# Patient Record
Sex: Female | Born: 1975 | Hispanic: No | Smoking: Never smoker
Health system: Southern US, Community
[De-identification: ages and names within clinical notes are randomized; demographics above are authoritative.]

## PROBLEM LIST (undated history)

## (undated) ENCOUNTER — Emergency Department: Admission: EM | Payer: 59

## (undated) DIAGNOSIS — R079 Chest pain, unspecified: Secondary | ICD-10-CM

## (undated) DIAGNOSIS — D649 Anemia, unspecified: Secondary | ICD-10-CM

## (undated) DIAGNOSIS — I10 Essential (primary) hypertension: Secondary | ICD-10-CM

## (undated) DIAGNOSIS — R7303 Prediabetes: Secondary | ICD-10-CM

## (undated) HISTORY — DX: Chest pain, unspecified: R07.9

## (undated) HISTORY — PX: DILATION AND CURETTAGE OF UTERUS: SHX78

---

## 2006-06-17 ENCOUNTER — Observation Stay: Payer: Self-pay | Admitting: Obstetrics and Gynecology

## 2006-06-24 ENCOUNTER — Inpatient Hospital Stay: Payer: Self-pay | Admitting: Obstetrics and Gynecology

## 2006-07-02 ENCOUNTER — Ambulatory Visit: Payer: Self-pay | Admitting: Obstetrics and Gynecology

## 2007-03-06 ENCOUNTER — Other Ambulatory Visit: Payer: Self-pay

## 2007-03-06 ENCOUNTER — Emergency Department: Payer: Self-pay | Admitting: Emergency Medicine

## 2007-03-06 ENCOUNTER — Encounter: Payer: Self-pay | Admitting: Cardiology

## 2007-03-08 ENCOUNTER — Ambulatory Visit: Payer: Self-pay | Admitting: Cardiology

## 2008-08-29 DIAGNOSIS — R079 Chest pain, unspecified: Secondary | ICD-10-CM

## 2009-12-14 ENCOUNTER — Emergency Department: Payer: Self-pay | Admitting: Emergency Medicine

## 2010-03-09 ENCOUNTER — Emergency Department: Payer: Self-pay | Admitting: Emergency Medicine

## 2010-08-06 NOTE — Assessment & Plan Note (Signed)
[facility] OFFICE NOTE   NAME:Gillott, Henli                        MRN:          [id number]  DATE:03/08/2007                            DOB:          14-Aug-1975    Ms. Pickerel is a 35 year old female with no prior cardiac history who I  am asked to evaluate for chest pain.  Note, she has no cardiac risk  factors.  She typically does not have exertional chest pain, nor is  there dyspnea on exertion, orthopnea, PND, pedal edema, palpitations,  presyncope, syncope or chest pain.  On March 05, 2007 while at work  she developed a sharp pain in the left chest area.  The pain radiated  down her left upper extremity.  This lasted for several seconds and then  was replaced with a soreness.  There was no associated nausea,  vomiting, shortness of breath or diaphoresis.  The pain would improve at  times, but it never completely resolved and has not since then.  The  pain could have a pleuritic component and also increased with certain  movements.  It was not exertional, it was not related to food.  She was  seen in the [facility] Room Saturday morning for the chest pain  as it had not resolved.  An electrocardiogram at that time showed a  sinus rhythm at a rate of 59 and there were no ST changes noted.  She  also had lab work that showed a CK-MB of 0.8 which was normal.  Her BUN  and creatinine were normal.  Her liver functions were normal.  Her  hemoglobin was 13.  Her troponin-I was less than 0.1.  She did have a  mildly elevated D-dimer at 0.89.  Her chest x-ray showed no acute  cardiopulmonary abnormality and a followup chest CT secondary to the  increased D-dimer showed no pulmonary embolus.  There was also mentioned  that there was no thoracic aortic aneurysm or dissection.  Note, she has  not traveled recently, nor has she injured her legs.  She is on no  medications at the present other than Motrin.  SHE  HAS NO KNOWN DRUG  ALLERGIES.   SOCIAL HISTORY:  She denies any drug use, alcohol use or tobacco use.   FAMILY HISTORY:  Negative for coronary artery disease.   PAST MEDICAL HISTORY:  No diabetes mellitus, hypertension or  hyperlipidemia.  She has had a previous C-section but no other surgeries  are noted.   REVIEW OF SYSTEMS:  She denies any headaches or fevers or chills.  There  is no productive cough or hemoptysis.  There is no dysphagia,  odynophagia, melena or hematochezia.  There is no dysuria or hematuria.  There is no rash or seizure activity.  There is no orthopnea, PND or  pedal edema.  The remaining systems are negative.   PHYSICAL EXAMINATION:  Shows a blood pressure of 120/88 initially.  On  followup in the right arm she was 148/90 and in left arm it was 138/88.  Her pulse is 71.  She weighs  237 pounds.  She is well-developed and well-  nourished, in no acute distress.  SKIN:  Warm and dry.  She does not appear to be depressed and there is no peripheral clubbing.  BACK:  Normal.  HEENT:  Normal with normal eyelids.  NECK:  Supple with normal upstroke bilaterally, no bruits noted.  There  is no jugular venous distention and no thyromegaly is noted.  CHEST:  Clear to auscultation with normal expansion.  CARDIOVASCULAR:  Reveals a regular rate and rhythm with normal S1 and  S2.  There are no murmurs, rubs or gallops noted.  She is minimally  tender to palpation in the left chest area.  ABDOMINAL:  Not tender or distended, positive bowel sounds, no  hepatosplenomegaly and no mass appreciated.  There is no abdominal  bruit.  She has 2+ femoral pulses bilaterally, no bruits.  EXTREMITIES:  Show no edema and I could palpate no cords.  She has 2+  dorsalis pedis pulses bilaterally.  NEUROLOGICAL:  Grossly intact.   Her electrocardiogram shows a sinus rhythm at a rate of 71.  The axis is  normal.  There are no ST changes noted.   DIAGNOSES:  Atypical chest pain -- Ms.  Berman symptoms are extremely  atypical for cardiac etiology.  Note, there are no rubs on examination.  Her electrocardiograms are normal.  Also note that she had blood work  drawn in [facility] Saturday morning which was 12 hours  after her symptoms started.  At that time her troponin was normal and  she has therefore ruled out.  Also note that her symptoms have been  persistent since then.  Her chest CT showed no pulmonary embolus or  dissection and her liver functions were normal.  I think this is most  likely musculoskeletal pain.  She will complete her course of Motrin.  If it does not improve after this then I have asked her to follow up  with Dr. __________  concerning that issue.  She will see [place] back on an  as-needed basis.     Madolyn Frieze Jens Som, MD, [facility]  Electronically Signed    BSC/MedQ  DD: 03/08/2007  DT: 03/08/2007  Job #: [id number]   cc:   Vonita Moss, MD

## 2010-09-12 ENCOUNTER — Encounter: Payer: Self-pay | Admitting: Cardiovascular Disease

## 2016-04-26 ENCOUNTER — Emergency Department
Admission: EM | Admit: 2016-04-26 | Discharge: 2016-04-26 | Disposition: A | Discharge status: Home / Self Care | Payer: BLUE CROSS/BLUE SHIELD | Attending: Emergency Medicine | Admitting: Emergency Medicine

## 2016-04-26 ENCOUNTER — Emergency Department: Payer: BLUE CROSS/BLUE SHIELD

## 2016-04-26 DIAGNOSIS — N938 Other specified abnormal uterine and vaginal bleeding: Secondary | ICD-10-CM

## 2016-04-26 DIAGNOSIS — N939 Abnormal uterine and vaginal bleeding, unspecified: Secondary | ICD-10-CM | POA: Diagnosis present

## 2016-04-26 HISTORY — DX: Anemia, unspecified: D64.9

## 2016-04-26 LAB — CBC
HCT: 34.4 % — ABNORMAL LOW (ref 35.0–47.0)
Hemoglobin: 10.5 g/dL — ABNORMAL LOW (ref 12.0–16.0)
MCH: 22.9 pg — ABNORMAL LOW (ref 26.0–34.0)
MCHC: 30.6 g/dL — ABNORMAL LOW (ref 32.0–36.0)
MCV: 74.7 fL — ABNORMAL LOW (ref 80.0–100.0)
Platelets: 343 10*3/uL (ref 150–440)
RBC: 4.6 MIL/uL (ref 3.80–5.20)
RDW: 17.6 % — ABNORMAL HIGH (ref 11.5–14.5)
WBC: 6.3 10*3/uL (ref 3.6–11.0)

## 2016-04-26 LAB — BASIC METABOLIC PANEL
Anion gap: 5 (ref 5–15)
BUN: 9 mg/dL (ref 6–20)
CO2: 26 mmol/L (ref 22–32)
Calcium: 8.9 mg/dL (ref 8.9–10.3)
Chloride: 104 mmol/L (ref 101–111)
Creatinine, Ser: 0.79 mg/dL (ref 0.44–1.00)
GFR calc Af Amer: >60 mL/min (ref >60)
GFR calc non Af Amer: >60 mL/min (ref >60)
Glucose, Bld: 147 mg/dL — ABNORMAL HIGH (ref 65–99)
Potassium: 3.6 mmol/L (ref 3.5–5.1)
Sodium: 135 mmol/L (ref 135–145)

## 2016-04-26 LAB — POCT PREGNANCY, URINE: Preg Test, Ur: NEGATIVE

## 2016-04-26 MED ORDER — NORGESTIMATE-ETH ESTRADIOL 0.25-35 MG-MCG PO TABS: 1.0000 | ORAL_TABLET | Freq: Every day | ORAL | 1 refills | Status: DC

## 2016-04-26 NOTE — ED Provider Notes (Signed)
[facility] Emergency Department Provider Note  Time seen: 2:43 PM  I have reviewed the triage vital signs and the nursing notes.   HISTORY  Chief Complaint Vaginal Bleeding    HPI Krista Garza is a 41 y.o. female with a past medical history of anemia, presents to the emergency department with vaginal bleeding. According to the patient she had sustained vaginal bleeding with large clots for 1 month ending approximately 2 weeks ago. Patient states she has never had this before. She saw a GYN on Tuesday who said that everything looked normal and was going to schedule her for an ultrasound. She states 2 days ago she once again started with heavy bleeding with clots show she came to the emergency department today for evaluation. Patient states she is anemic at baseline with a hemoglobin usually around 9 or 10 per patient. Patient was concerned given the recent heavy bleeding and now return of bleeding that she could have a low blood level.States lower abdominal cramping consistent with a menstrual cycle but denies any "pain."  Past Medical History:  Diagnosis Date  . Anemia   . Chest pain, unspecified     Patient Active Problem List   Diagnosis Date Noted  . CHEST PAIN-UNSPECIFIED 08/29/2008    Past Surgical History:  Procedure Laterality Date  . CESAREAN SECTION      Prior to Admission medications   Not on File    No Known Allergies  No family history on file.  Social History Social History  Substance Use Topics  . Smoking status: Unknown If Ever Smoked  . Smokeless tobacco: Never Used  . Alcohol use No    Review of Systems Constitutional: Negative for fever Cardiovascular: Negative for chest pain. Respiratory: Negative for shortness of breath. Gastrointestinal:Mild lower abdominal cramping. Negative for nausea and vomiting or diarrhea. Genitourinary: Negative for dysuria. Positive vaginal bleeding with clot. Neurological: Negative for  headache 10-point ROS otherwise negative.  ____________________________________________   PHYSICAL EXAM:  VITAL SIGNS: ED Triage Vitals  Enc Vitals Group     BP 04/26/16 1234 (!) 158/83     Pulse Rate 04/26/16 1234 62     Resp 04/26/16 1234 16     Temp 04/26/16 1234 98.1 F (36.7 C)     Temp Source 04/26/16 1234 Oral     SpO2 04/26/16 1234 98 %     Weight 04/26/16 1235 236 lb (107 kg)     Height 04/26/16 1235 6\' 2"  (1.88 m)     Head Circumference --      Peak Flow --      Pain Score --      Pain Loc --      Pain Edu? --      Excl. in [place]? --     Constitutional: Alert and oriented. Well appearing and in no distress. Eyes: Normal exam ENT   Head: Normocephalic and atraumatic.   Mouth/Throat: Mucous membranes are moist. Cardiovascular: Normal rate, regular rhythm. No murmur Respiratory: Normal respiratory effort without tachypnea nor retractions. Breath sounds are clear  Gastrointestinal: Soft and nontender. No distention.  Musculoskeletal: Nontender with normal range of motion in all extremities Neurologic:  Normal speech and language. No gross focal neurologic deficits Skin:  Skin is warm, dry and intact.  Psychiatric: Mood and affect are normal.   ____________________________________________     RADIOLOGY  IMPRESSION: 1. Thickened endometrial stripe measuring 23.4 mm. If bleeding remains unresponsive to hormonal or medical therapy, focal lesion work-up with  sonohysterogram should be considered. Endometrial biopsy should also be considered in pre-menopausal patients at high risk for endometrial carcinoma. (Ref: Radiological Reasoning: Algorithmic Workup of Abnormal Vaginal Bleeding with Endovaginal Sonography and Sonohysterography. AJR 2008; [id number]) 2. Normal appearance of the right ovary; nonvisualized left ovary.  ____________________________________________   INITIAL IMPRESSION / ASSESSMENT AND PLAN / ED COURSE  Pertinent labs & imaging results  that were available during my care of the patient were reviewed by me and considered in my medical decision making (see chart for details).  Patient presents emergency Department with dysfunctional uterine bleeding. Continues to state heavy bleeding. Into her OB/GYN several days ago and had a pelvic exam performed and stated everything looked normal. Today We will obtain an ultrasound to evaluate. Patient's labs are largely within normal limits, hemoglobin is largely unchanged from her last hemoglobin recorded in October.  Ultrasound shows a thickened endometrial stripe of 23 mm. We will place the patient on a taper of Sprintec. Patient is follow up with her OB on Monday. I discussed with the patient returning to the emergency department for any increase in bleeding, lightheadedness, dizziness, or the bleeding fails to respond to the hormonal treatment in the next 24-48 hours.  ____________________________________________   FINAL CLINICAL IMPRESSION(S) / ED DIAGNOSES  Dysfunctional uterine bleeding    Harvest Dark, MD 04/26/16 [id number]

## 2016-04-26 NOTE — Discharge Instructions (Signed)
Please take your medication as prescribed. As we discussed return to the emergency department for any dizziness, lightheadedness, increased bleeding or failure of the bleeding to decrease after 24-48 hours. Otherwise please follow-up with your OB/GYN on Monday for recheck/reevaluation.

## 2016-04-26 NOTE — ED Triage Notes (Signed)
Pt came to ED c/o adnormal vaginal bleeding. Has been diagnosed with being anemic. Has had abnormal bleeding in the past. Saw specialist but has not been diagnosed with anything. VS stable.

## 2018-03-29 IMAGING — US US TRANSVAGINAL NON-OB
1 series · 13 of 25 positions shown · non-contrast
Comparison: None

CLINICAL DATA: Heavy vaginal bleeding since 04/24/2016. Gravida 4
para 4.



[Series 1: us transvaginal non-ob · 0.26mm/px · 113 acquisitions, 13 frames shown]
[im 1/113]
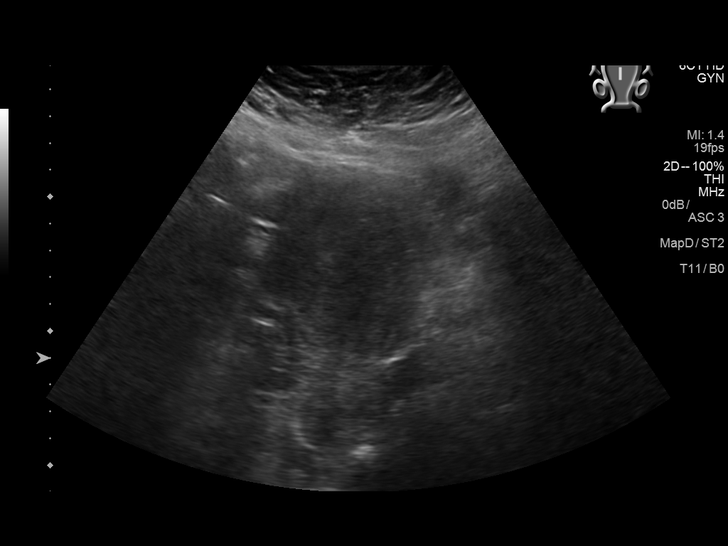
[im 10/113]
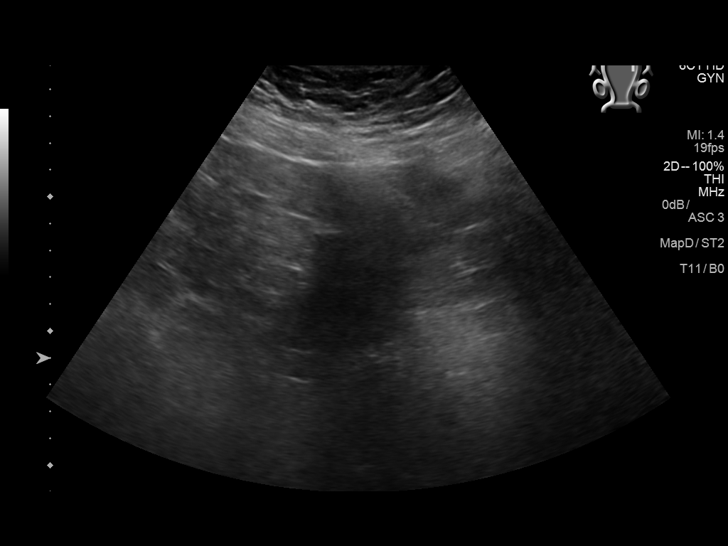
[im 19/113]
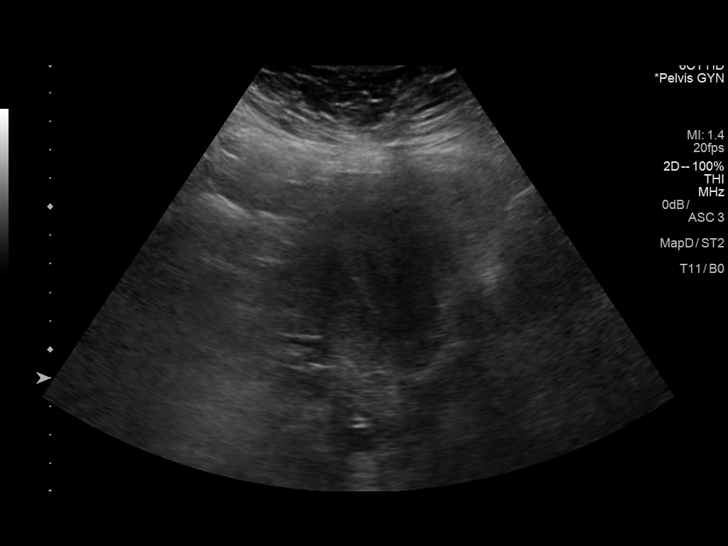
[im 29/113]
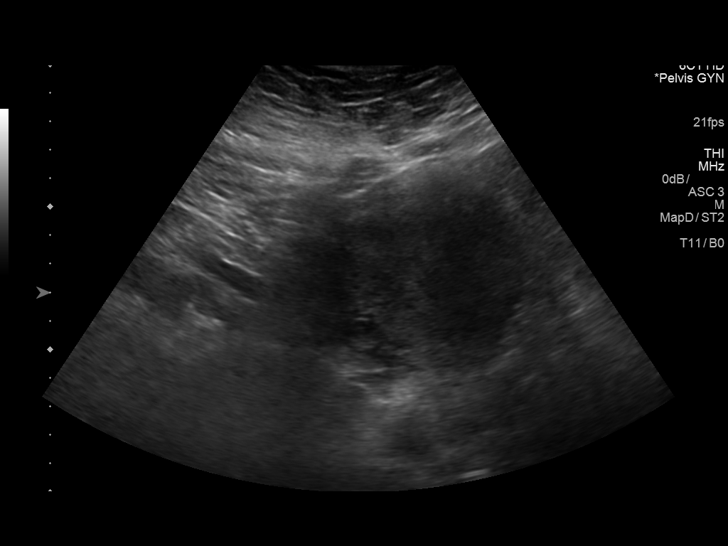
[im 38/113]
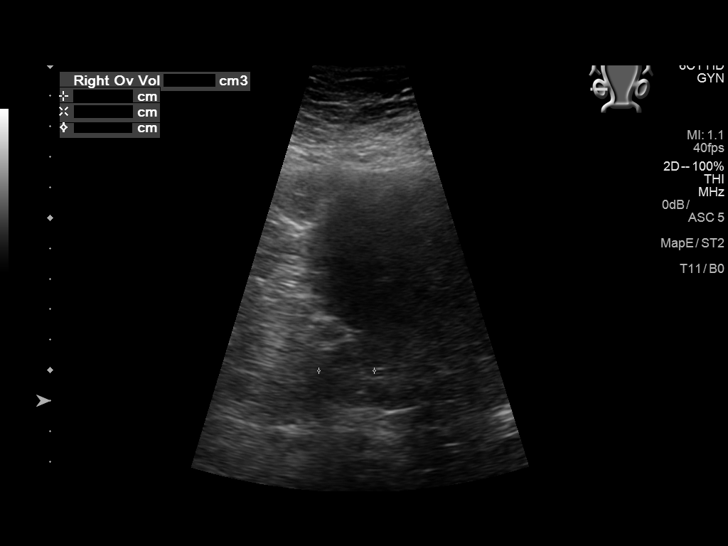
[im 47/113]
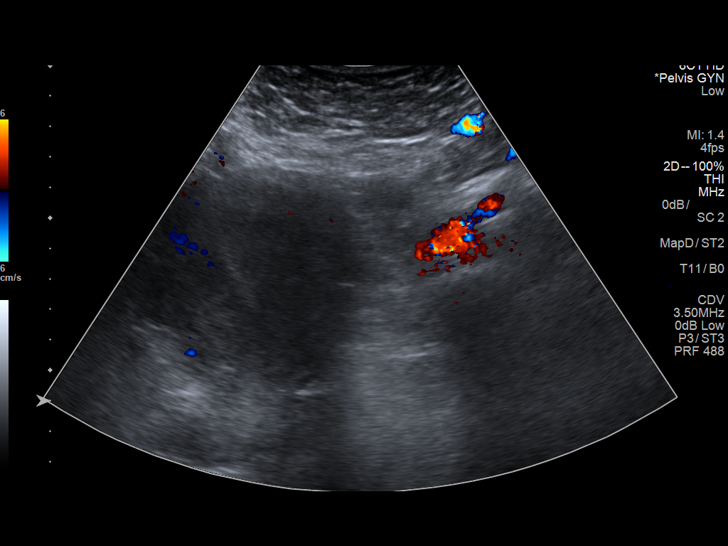
[im 57/113]
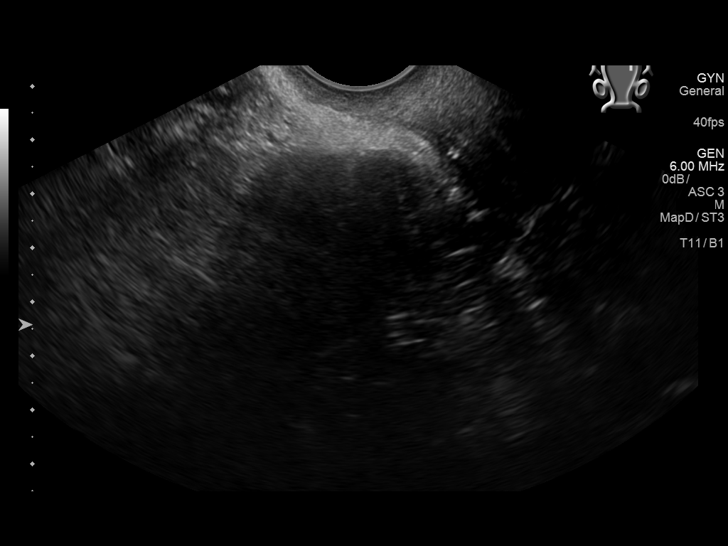
[im 66/113]
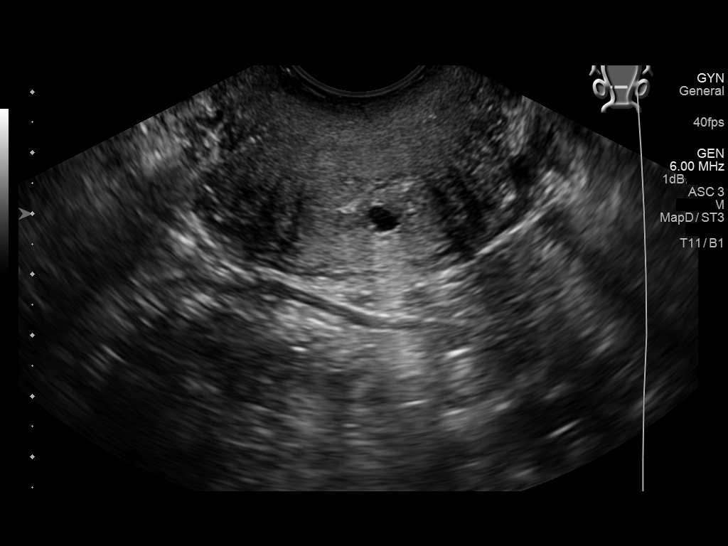
[im 75/113]
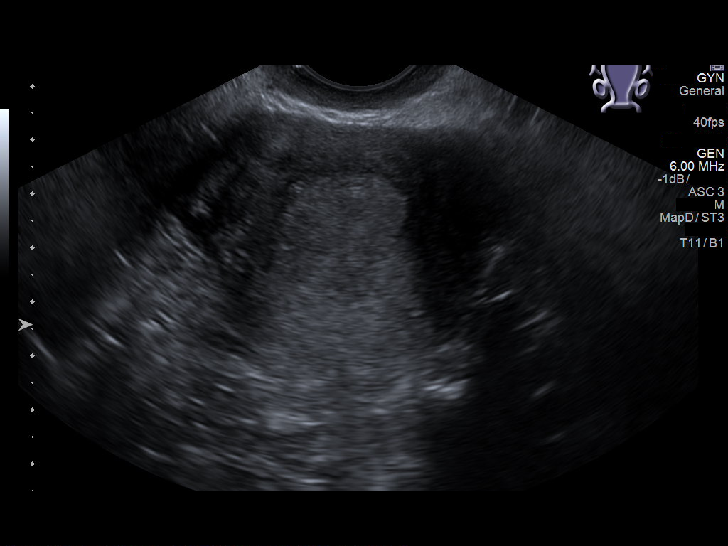
[im 85/113]
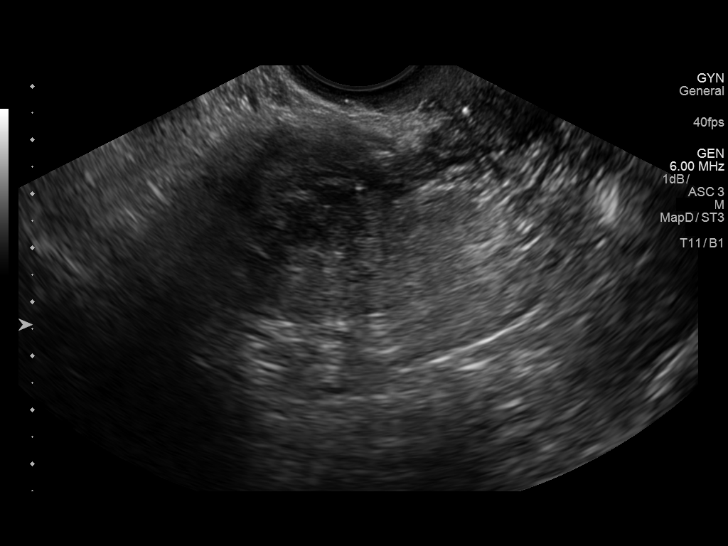
[im 94/113]
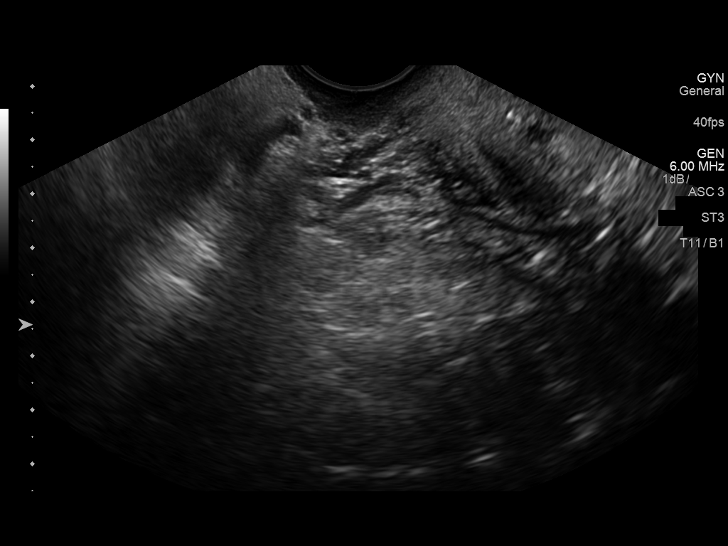
[im 103/113]
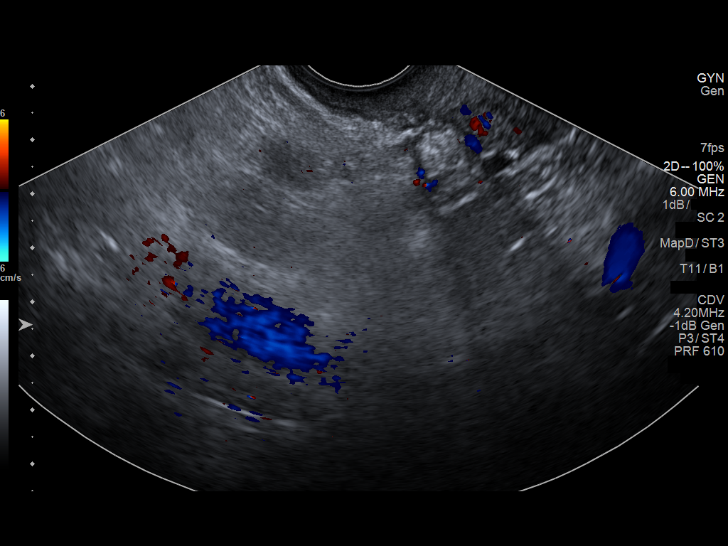
[im 113/113]
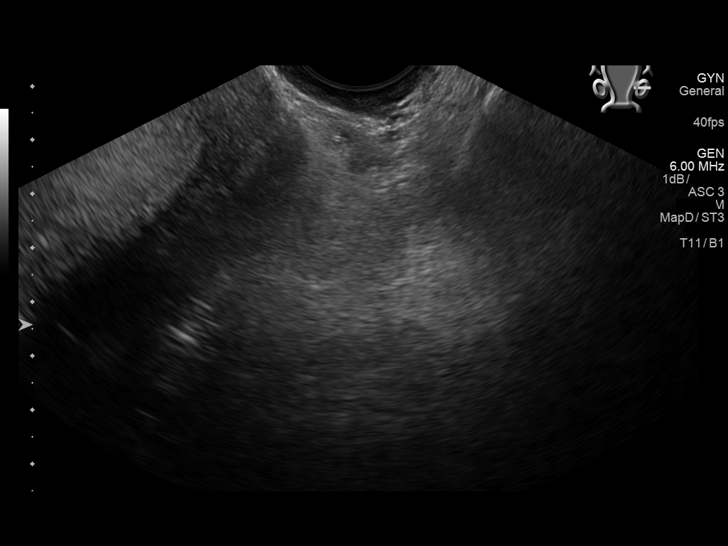

[13 of 25 positions shown; findings below may reference images not displayed]

FINDINGS: Uterus

Measurements: 11.0 x 6.1 x 6.2 cm. No fibroids or other mass
visualized.

Endometrium

Thickness: 23.4 mm. No discrete lesions are identified. Endometrium
appears homogeneous and vascular on Doppler evaluation.

Right ovary

Measurements: 3.1 x 1.7 x 1.8 cm. Normal appearance/no adnexal mass.

Left ovary

Measurements: The ovary is not visualized, either absent or obscured
. No adnexal mass identified.

Other findings

Trace free pelvic fluid.
IMPRESSION: 1. Thickened endometrial stripe measuring 23.4 mm. If bleeding
remains unresponsive to hormonal or medical therapy, focal lesion
work-up with sonohysterogram should be considered. Endometrial
biopsy should also be considered in pre-menopausal patients at high
risk for endometrial carcinoma. (Ref: Radiological Reasoning:
Algorithmic Workup of Abnormal Vaginal Bleeding with Endovaginal
Sonography and Sonohysterography. AJR 6999; [id number])
2. Normal appearance of the right ovary; nonvisualized left ovary.

## 2019-03-02 ENCOUNTER — Ambulatory Visit (INDEPENDENT_AMBULATORY_CARE_PROVIDER_SITE_OTHER): Payer: No Typology Code available for payment source | Admitting: Family Medicine

## 2019-03-02 ENCOUNTER — Encounter: Payer: Self-pay | Admitting: Family Medicine

## 2019-03-02 ENCOUNTER — Other Ambulatory Visit: Payer: Self-pay

## 2019-03-02 VITALS — BP 122/86 | HR 94 | Temp 98.3°F | Resp 14 | Ht 74.0 in | Wt 251.8 lb

## 2019-03-02 DIAGNOSIS — H539 Unspecified visual disturbance: Secondary | ICD-10-CM | POA: Diagnosis not present

## 2019-03-02 DIAGNOSIS — Z862 Personal history of diseases of the blood and blood-forming organs and certain disorders involving the immune mechanism: Secondary | ICD-10-CM | POA: Diagnosis not present

## 2019-03-02 DIAGNOSIS — Z7689 Persons encountering health services in other specified circumstances: Secondary | ICD-10-CM

## 2019-03-02 DIAGNOSIS — R002 Palpitations: Secondary | ICD-10-CM

## 2019-03-02 DIAGNOSIS — N924 Excessive bleeding in the premenopausal period: Secondary | ICD-10-CM | POA: Diagnosis not present

## 2019-03-02 DIAGNOSIS — Z800 Family history of malignant neoplasm of digestive organs: Secondary | ICD-10-CM

## 2019-03-02 DIAGNOSIS — R55 Syncope and collapse: Secondary | ICD-10-CM

## 2019-03-02 DIAGNOSIS — Z1211 Encounter for screening for malignant neoplasm of colon: Secondary | ICD-10-CM

## 2019-03-02 DIAGNOSIS — D509 Iron deficiency anemia, unspecified: Secondary | ICD-10-CM

## 2019-03-02 NOTE — Progress Notes (Signed)
Name: Krista Garza   MRN: [id number]    DOB: 1976-02-13   Date:03/07/2019       Progress Note  Chief Complaint  Patient presents with  . Establish Care  . Contraception    wants referral for gyn for birth control implant in arm  . Near Syncope    several episodes in last year states her vision get blurry in left eye and feels dizzy     Subjective:   Krista Garza is a 43 y.o. female, presents to clinic to establish care. She was previously seen at [facility] primary care  She would like OBGYN referral to get nexplanon for worsening periods Menses right now once a month, last 5-7 days, varies how heavy periods are - "gushing" has to change tampons and thick pads every 1.5 to 2 hours and heaviness lasts 2-3 days and it lightens up.   Her mom went through menopause before age 52   She has 4 children:  G4P4 2  vag deliveries, 2 c-sections   Two boys - 02/04/93 and 03/25/96 - NVD C-section 06/2006 01/31/00 D&C in 2001 or 2002 after birth of third child 01/31/2000 - C-section  She states she is going to get an optometry appt to check eyes  She is also concerned with intermittent dizziness and near passing out episodes happening over the past year.    She suspects perimenopausal sx, hot and cold flashes, worsening periods    She says she is having random episodes where she will have some blurry vision in her left eye she feels like she might be little bit dizzy or pass out it lasts only a few minutes and then it passes.  A few times this is made her very anxious and she has had some secondary rapid heart rate feeling like she hyperventilates.  She has discussed this with family members and they have reassured her that she is probably just anxious or having menopausal symptoms.  She has never had associated chest pain, syncopal episode, chest tightness, shortness of breath, orthopnea, PND, lower extremity edema.  Patient Active Problem List   Diagnosis Date Noted  . CHEST  PAIN-UNSPECIFIED 08/29/2008    Past Surgical History:  Procedure Laterality Date  . CESAREAN SECTION     x2    Family History  Problem Relation Age of Onset  . Diabetes Father   . Hyperlipidemia Father   . Hypertension Father   . Cancer Father 22       colon  . Colon cancer Father     Social History   Socioeconomic History  . Marital status: Divorced    Spouse name: Not on file  . Number of children: 4  . Years of education: 67  . Highest education level: Associate degree: occupational, Hotel manager, or vocational program  Occupational History  . Occupation: Full time  Tobacco Use  . Smoking status: Never Smoker  . Smokeless tobacco: Never Used  Substance and Sexual Activity  . Alcohol use: Yes    Comment: margaritas occasionally  . Drug use: No  . Sexual activity: Not Currently  Other Topics Concern  . Not on file  Social History Narrative   Divorced   Does not get regular exercise   Social Determinants of Health   Financial Resource Strain: Low Risk   . Difficulty of Paying Living Expenses: Not hard at all  Food Insecurity: No Food Insecurity  . Worried About Charity fundraiser in the Last Year: Never  true  . Ran Out of Food in the Last Year: Never true  Transportation Needs: No Transportation Needs  . Lack of Transportation (Medical): No  . Lack of Transportation (Non-Medical): No  Physical Activity: Sufficiently Active  . Days of Exercise per Week: 2 days  . Minutes of Exercise per Session: 80 min  Stress: No Stress Concern Present  . Feeling of Stress : Not at all  Social Connections: Somewhat Isolated  . Frequency of Communication with Friends and Family: More than three times a week  . Frequency of Social Gatherings with Friends and Family: Never  . Attends Religious Services: More than 4 times per year  . Active Member of Clubs or Organizations: No  . Attends Archivist Meetings: Never  . Marital Status: Divorced  Human resources officer  Violence: Not At Risk  . Fear of Current or Ex-Partner: No  . Emotionally Abused: No  . Physically Abused: No  . Sexually Abused: No    No current outpatient medications on file.  No Known Allergies  I personally reviewed active problem list, medication list, allergies, family history, social history, health maintenance, notes from last encounter, lab results, imaging with the patient/caregiver today.  Review of Systems  Constitutional: Negative.  Negative for activity change, appetite change, fatigue and unexpected weight change.  HENT: Negative.   Eyes: Positive for visual disturbance. Negative for photophobia, pain, discharge, redness and itching.  Respiratory: Negative.  Negative for shortness of breath.   Cardiovascular: Negative.  Negative for chest pain, palpitations and leg swelling.  Gastrointestinal: Negative.  Negative for abdominal pain and blood in stool.  Endocrine: Negative.   Genitourinary: Negative.   Musculoskeletal: Negative.  Negative for arthralgias, gait problem, joint swelling and myalgias.  Skin: Negative.  Negative for color change, pallor and rash.  Allergic/Immunologic: Negative.   Neurological: Negative.  Negative for syncope and weakness.  Hematological: Negative.   Psychiatric/Behavioral: Negative.  Negative for confusion, dysphoric mood, self-injury and suicidal ideas. The patient is not nervous/anxious.   All other systems reviewed and are negative.    Objective:    Vitals:   03/02/19 1025  BP: 122/86  Pulse: 94  Resp: 14  Temp: 98.3 F (36.8 C)  SpO2: 96%  Weight: 251 lb 12.8 oz (114.2 kg)  Height: 6\' 2"  (1.88 m)    Body mass index is 32.33 kg/m.  Physical Exam Vitals and nursing note reviewed.  Constitutional:      General: She is not in acute distress.    Appearance: Normal appearance. She is well-developed. She is obese. She is not ill-appearing, toxic-appearing or diaphoretic.  HENT:     Head: Normocephalic and atraumatic.      Right Ear: External ear normal.     Left Ear: External ear normal.     Nose: Nose normal.     Mouth/Throat:     Mouth: Mucous membranes are moist.     Pharynx: Oropharynx is clear. Uvula midline.  Eyes:     General: Lids are normal. No scleral icterus.       Right eye: No discharge.        Left eye: No discharge.     Extraocular Movements: Extraocular movements intact.     Conjunctiva/sclera: Conjunctivae normal.     Pupils: Pupils are equal, round, and reactive to light.  Neck:     Thyroid: No thyromegaly.     Trachea: Phonation normal. No tracheal deviation.  Cardiovascular:     Rate and Rhythm: Normal rate  and regular rhythm.     Pulses: Normal pulses.          Radial pulses are 2+ on the right side and 2+ on the left side.       Posterior tibial pulses are 2+ on the right side and 2+ on the left side.     Heart sounds: Normal heart sounds. No murmur. No friction rub. No gallop.   Pulmonary:     Effort: Pulmonary effort is normal. No respiratory distress.     Breath sounds: Normal breath sounds. No stridor. No wheezing, rhonchi or rales.  Chest:     Chest wall: No tenderness.  Abdominal:     General: Bowel sounds are normal. There is no distension.     Palpations: Abdomen is soft.     Tenderness: There is no abdominal tenderness. There is no guarding or rebound.  Musculoskeletal:        General: No deformity. Normal range of motion.     Cervical back: Normal range of motion and neck supple.  Lymphadenopathy:     Cervical: No cervical adenopathy.  Skin:    General: Skin is warm and dry.     Capillary Refill: Capillary refill takes less than 2 seconds.     Coloration: Skin is not pale.     Findings: No rash.  Neurological:     General: No focal deficit present.     Mental Status: She is alert and oriented to person, place, and time.     Cranial Nerves: No cranial nerve deficit.     Sensory: No sensory deficit.     Motor: No weakness or abnormal muscle tone.      Coordination: Coordination normal.     Gait: Gait normal.  Psychiatric:        Mood and Affect: Mood normal.        Speech: Speech normal.        Behavior: Behavior normal.        Recent Results (from the past 2160 hour(s))  CBC w/ Diff     Status: Abnormal   Collection Time: 03/02/19 12:00 AM  Result Value Ref Range   WBC 5.7 3.8 - 10.8 Thousand/uL   RBC 5.04 3.80 - 5.10 Million/uL   Hemoglobin 9.4 (L) 11.7 - 15.5 g/dL   HCT 34.4 (L) 35.0 - 45.0 %   MCV 68.3 (L) 80.0 - 100.0 fL   MCH 18.7 (L) 27.0 - 33.0 pg   MCHC 27.3 (L) 32.0 - 36.0 g/dL   RDW 17.1 (H) 11.0 - 15.0 %   Platelets 429 (H) 140 - 400 Thousand/uL   MPV 10.6 7.5 - 12.5 fL   Neutro Abs 3,141 1,500 - 7,800 cells/uL   Lymphs Abs 1,653 850 - 3,900 cells/uL   Absolute Monocytes 638 200 - 950 cells/uL   Eosinophils Absolute 211 15 - 500 cells/uL   Basophils Absolute 57 0 - 200 cells/uL   Neutrophils Relative % 55.1 %   Total Lymphocyte 29.0 %   Monocytes Relative 11.2 %   Eosinophils Relative 3.7 %   Basophils Relative 1.0 %  CMP w GFR     Status: None   Collection Time: 03/02/19 12:00 AM  Result Value Ref Range   Glucose, Bld 83 65 - 99 mg/dL    Comment: .            Fasting reference interval .    BUN 11 7 - 25 mg/dL   Creat 0.70 0.50 - 1.10  mg/dL   GFR, Est Non African American 105 > OR = 60 mL/min/1.18m2   GFR, Est African American 121 > OR = 60 mL/min/1.10m2   BUN/Creatinine Ratio NOT APPLICABLE 6 - 22 (calc)   Sodium 137 135 - 146 mmol/L   Potassium 4.2 3.5 - 5.3 mmol/L   Chloride 104 98 - 110 mmol/L   CO2 26 20 - 32 mmol/L   Calcium 9.4 8.6 - 10.2 mg/dL   Total Protein 7.3 6.1 - 8.1 g/dL   Albumin 4.0 3.6 - 5.1 g/dL   Globulin 3.3 1.9 - 3.7 g/dL (calc)   AG Ratio 1.2 1.0 - 2.5 (calc)   Total Bilirubin 0.3 0.2 - 1.2 mg/dL   Alkaline phosphatase (APISO) 47 31 - 125 U/L   AST 15 10 - 35 U/L   ALT 8 6 - 29 U/L  TSH     Status: None   Collection Time: 03/02/19 12:00 AM  Result Value Ref Range    TSH 1.50 mIU/L    Comment:           Reference Range .           > or = 20 Years  0.40-4.50 .                Pregnancy Ranges           First trimester    0.26-2.66           Second trimester   0.55-2.73           Third trimester    0.43-2.91   CBC MORPHOLOGY     Status: None   Collection Time: 03/02/19 12:00 AM  Result Value Ref Range   CBC MORPHOLOGY  NORMAL    Comment: Elliptocytes 1 + Poikilocytosis 1 + Hypochromasia 2 +      PHQ2/9: Depression screen [facility] 2/9 03/02/2019  Decreased Interest 0  Down, Depressed, Hopeless 0  PHQ - 2 Score 0  Altered sleeping 0  Tired, decreased energy 0  Change in appetite 0  Feeling bad or failure about yourself  0  Trouble concentrating 0  Moving slowly or fidgety/restless 0  Suicidal thoughts 0  PHQ-9 Score 0  Difficult doing work/chores Not difficult at all    phq 9 is negative reviewed  Fall Risk: Fall Risk  03/02/2019  Falls in the past year? 0  Number falls in past yr: 0  Injury with Fall? 0      Functional Status Survey: Is the patient deaf or have difficulty hearing?: No Does the patient have difficulty seeing, even when wearing glasses/contacts?: No Does the patient have difficulty concentrating, remembering, or making decisions?: No Does the patient have difficulty walking or climbing stairs?: No Does the patient have difficulty dressing or bathing?: No Does the patient have difficulty doing errands alone such as visiting a doctor's office or shopping?: No    Assessment & Plan:     ICD-10-CM   1. Visual disturbances  H53.9    brief vision disturbances with episodes that are brief, occurring few times for few minutes over the past year -grossly normal neuro and [facility] here today - f/up  2. History of anemia  Z86.2 CBC w/ Diff   with her sx, r/o anemia, check CBCf  3. Near syncope  R55 CBC w/ Diff    CMP w GFR    TSH   BP and HR WNL today, pt encouraged to push hydration, check labs to r/o thyroid dysfunction,  anemia, electrolyte derangement  4. Excessive bleeding in premenopausal period  N92.4 Ambulatory referral to Obstetrics / Gynecology   heavier periods - OBGYN f/up - may be normal perimenopause sx, could be evaluated for polyps, tx with OCPs/progesterone etc  5. Palpitations  R00.2 CBC w/ Diff    TSH   r/o thyroid dysfunction, anemia, electrolyte abnormality, cardiac exam here RRR, no current sx, past ER visits, possibly some anxiety sx, but r/o other causes  6. Family history of colon cancer in father  Z62.0 Ambulatory referral to Gastroenterology   young [place] in father, refer to GI for colonoscopy  7. Colon cancer screening  Z12.11 Ambulatory referral to Gastroenterology  8. Microcytic anemia  D50.9 Iron, TIBC and Ferritin Panel  9. Encounter to establish care with new doctor  Z76.89    reviewed available records    Plan for visual discurbances is to have pt go to optometry or ophtho - we discussed differences and she want to go get just the basic eye exam done with optometry - her insurance requires her to arrange through [facility] - talked to her about that process.  F/up in office ASAP if any worsening in frequency or severity  Some worsening periods, some other sx may be perimenopausal sx, she is going to GYN as well to discuss tx options, she wants hysterectomy  New here - will obtain and review records  Return in about 3 months (around 05/31/2019) for Routine follow-up/physical when due.   Delsa Grana, PA-C 03/02/19 4:43 PM

## 2019-03-04 ENCOUNTER — Telehealth: Payer: Self-pay | Admitting: Family Medicine

## 2019-03-08 ENCOUNTER — Encounter: Payer: Self-pay | Admitting: *Deleted

## 2019-03-08 LAB — COMPLETE METABOLIC PANEL WITH GFR
AG Ratio: 1.2 (calc) (ref 1.0–2.5)
ALT: 8 U/L (ref 6–29)
AST: 15 U/L (ref 10–35)
Albumin: 4 g/dL (ref 3.6–5.1)
Alkaline phosphatase (APISO): 47 U/L (ref 31–125)
BUN: 11 mg/dL (ref 7–25)
CO2: 26 mmol/L (ref 20–32)
Calcium: 9.4 mg/dL (ref 8.6–10.2)
Chloride: 104 mmol/L (ref 98–110)
Creat: 0.7 mg/dL (ref 0.50–1.10)
GFR, Est African American: 121 mL/min/{1.73_m2} (ref >=60)
GFR, Est Non African American: 105 mL/min/{1.73_m2} (ref >=60)
Globulin: 3.3 g/dL (calc) (ref 1.9–3.7)
Glucose, Bld: 83 mg/dL (ref 65–99)
Potassium: 4.2 mmol/L (ref 3.5–5.3)
Sodium: 137 mmol/L (ref 135–146)
Total Bilirubin: 0.3 mg/dL (ref 0.2–1.2)
Total Protein: 7.3 g/dL (ref 6.1–8.1)

## 2019-03-08 LAB — TEST AUTHORIZATION

## 2019-03-08 LAB — CBC WITH DIFFERENTIAL/PLATELET
Absolute Monocytes: 638 cells/uL (ref 200–950)
Basophils Absolute: 57 cells/uL (ref 0–200)
Basophils Relative: 1 %
Eosinophils Absolute: 211 cells/uL (ref 15–500)
Eosinophils Relative: 3.7 %
HCT: 34.4 % — ABNORMAL LOW (ref 35.0–45.0)
Hemoglobin: 9.4 g/dL — ABNORMAL LOW (ref 11.7–15.5)
Lymphs Abs: 1653 cells/uL (ref 850–3900)
MCH: 18.7 pg — ABNORMAL LOW (ref 27.0–33.0)
MCHC: 27.3 g/dL — ABNORMAL LOW (ref 32.0–36.0)
MCV: 68.3 fL — ABNORMAL LOW (ref 80.0–100.0)
MPV: 10.6 fL (ref 7.5–12.5)
Monocytes Relative: 11.2 %
Neutro Abs: 3141 cells/uL (ref 1500–7800)
Neutrophils Relative %: 55.1 %
Platelets: 429 10*3/uL — ABNORMAL HIGH (ref 140–400)
RBC: 5.04 10*6/uL (ref 3.80–5.10)
RDW: 17.1 % — ABNORMAL HIGH (ref 11.0–15.0)
Total Lymphocyte: 29 %
WBC: 5.7 10*3/uL (ref 3.8–10.8)

## 2019-03-08 LAB — IRON,TIBC AND FERRITIN PANEL
%SAT: 4 % (calc) — ABNORMAL LOW (ref 16–45)
Ferritin: <1 ng/mL — ABNORMAL LOW (ref 16–232)
Iron: 17 ug/dL — ABNORMAL LOW (ref 40–190)
TIBC: 443 mcg/dL (calc) (ref 250–450)

## 2019-03-08 LAB — TSH: TSH: 1.5 mIU/L

## 2019-03-08 LAB — CBC MORPHOLOGY

## 2019-03-09 ENCOUNTER — Encounter: Payer: Self-pay | Admitting: Family Medicine

## 2019-03-14 ENCOUNTER — Telehealth: Payer: Self-pay | Admitting: Gastroenterology

## 2019-03-14 NOTE — Telephone Encounter (Signed)
Pt left vm to schedule a colonoscopy   °

## 2019-03-15 NOTE — Telephone Encounter (Signed)
Not seen

## 2019-03-21 NOTE — Telephone Encounter (Signed)
LVM for pt to call office to schedule her colonoscopy. Referral will need to be re-opened.  Scheduling Note:  Z80.0 (ICD-10-CM) - Family history of colon cancer in father  Z44.11 (ICD-10-CM) - Colon cancer screening   Thanks Sharyn Lull

## 2019-03-29 ENCOUNTER — Ambulatory Visit (INDEPENDENT_AMBULATORY_CARE_PROVIDER_SITE_OTHER): Payer: No Typology Code available for payment source | Admitting: Obstetrics and Gynecology

## 2019-03-29 ENCOUNTER — Other Ambulatory Visit (HOSPITAL_COMMUNITY)
Admission: RE | Admit: 2019-03-29 | Discharge: 2019-03-29 | Disposition: A | Discharge status: Home / Self Care | Payer: No Typology Code available for payment source | Source: Ambulatory Visit | Attending: Obstetrics and Gynecology | Admitting: Obstetrics and Gynecology

## 2019-03-29 ENCOUNTER — Other Ambulatory Visit: Payer: Self-pay

## 2019-03-29 ENCOUNTER — Encounter: Payer: Self-pay | Admitting: Obstetrics and Gynecology

## 2019-03-29 VITALS — BP 128/72 | HR 84 | Ht 74.5 in | Wt 249.0 lb

## 2019-03-29 DIAGNOSIS — D500 Iron deficiency anemia secondary to blood loss (chronic): Secondary | ICD-10-CM

## 2019-03-29 DIAGNOSIS — N939 Abnormal uterine and vaginal bleeding, unspecified: Secondary | ICD-10-CM

## 2019-03-29 DIAGNOSIS — R790 Abnormal level of blood mineral: Secondary | ICD-10-CM

## 2019-03-29 DIAGNOSIS — Z124 Encounter for screening for malignant neoplasm of cervix: Secondary | ICD-10-CM | POA: Insufficient documentation

## 2019-03-29 MED ORDER — MEDROXYPROGESTERONE ACETATE 150 MG/ML IM SUSP: 150.0000 mg | INTRAMUSCULAR | 3 refills | Status: DC

## 2019-03-29 NOTE — Progress Notes (Signed)
   Patient ID: Krista Garza, female   DOB: Sep 20, 1975, 44 y.o.   MRN: [id number]  Reason for Consult: Gynecologic Exam (prolonged menses)   Referred by Delsa Grana, PA-C  Subjective:     HPI:  Krista Garza is a 44 y.o. female. She is following up for abnormal uterine bleeding.    Past Medical History:  Diagnosis Date  . Anemia   . Chest pain, unspecified    Family History  Problem Relation Age of Onset  . Diabetes Father   . Hyperlipidemia Father   . Hypertension Father   . Cancer Father 70       colon  . Colon cancer Father    Past Surgical History:  Procedure Laterality Date  . CESAREAN SECTION     x2    Short Social History:  Social History   Tobacco Use  . Smoking status: Never Smoker  . Smokeless tobacco: Never Used  Substance Use Topics  . Alcohol use: Yes    Comment: margaritas occasionally    No Known Allergies  Current Outpatient Medications  Medication Sig Dispense Refill  . Multiple Vitamins-Minerals (WOMENS ONE DAILY PO) Take by mouth.    . ferrous sulfate 325 (65 FE) MG EC tablet Take 325 mg by mouth 3 (three) times daily with meals.    . medroxyPROGESTERone (DEPO-PROVERA) 150 MG/ML injection Inject 1 mL (150 mg total) into the muscle every 3 (three) months. 1 mL 3   No current facility-administered medications for this visit.    REVIEW OF SYSTEMS      Objective:  Objective   Vitals:   03/29/19 0812  BP: 128/72  Pulse: 84  Weight: 249 lb (112.9 kg)  Height: 6' 2.5" (1.892 m)   Body mass index is 31.54 kg/m.  Physical Exam       Assessment/Plan:     44 yo with abnormal uterine bleeding Failed EMB- cervical stenosis, will schedule hysteroscopy D&C Will start Depo Provera Does not tolerate oral iron, urgent hematology referral for IV iron  [place], [facility] 03/29/2019 9:28 AM

## 2019-03-31 ENCOUNTER — Encounter: Payer: Self-pay | Admitting: Oncology

## 2019-03-31 ENCOUNTER — Ambulatory Visit (INDEPENDENT_AMBULATORY_CARE_PROVIDER_SITE_OTHER): Payer: No Typology Code available for payment source

## 2019-03-31 ENCOUNTER — Inpatient Hospital Stay: Payer: No Typology Code available for payment source | Attending: Oncology | Admitting: Oncology

## 2019-03-31 ENCOUNTER — Other Ambulatory Visit: Payer: Self-pay

## 2019-03-31 DIAGNOSIS — Z800 Family history of malignant neoplasm of digestive organs: Secondary | ICD-10-CM | POA: Diagnosis not present

## 2019-03-31 DIAGNOSIS — Z8349 Family history of other endocrine, nutritional and metabolic diseases: Secondary | ICD-10-CM | POA: Diagnosis not present

## 2019-03-31 DIAGNOSIS — R7989 Other specified abnormal findings of blood chemistry: Secondary | ICD-10-CM | POA: Diagnosis not present

## 2019-03-31 DIAGNOSIS — Z8249 Family history of ischemic heart disease and other diseases of the circulatory system: Secondary | ICD-10-CM | POA: Diagnosis not present

## 2019-03-31 DIAGNOSIS — Z833 Family history of diabetes mellitus: Secondary | ICD-10-CM | POA: Insufficient documentation

## 2019-03-31 DIAGNOSIS — D500 Iron deficiency anemia secondary to blood loss (chronic): Secondary | ICD-10-CM | POA: Diagnosis not present

## 2019-03-31 DIAGNOSIS — Z30013 Encounter for initial prescription of injectable contraceptive: Secondary | ICD-10-CM

## 2019-03-31 MED ORDER — MEDROXYPROGESTERONE ACETATE 150 MG/ML IM SUSY
150.0000 mg | PREFILLED_SYRINGE | INTRAMUSCULAR | Status: AC
Start: 2019-03-31 — End: 2019-03-31
  Administered 2019-03-31: 150 mg via INTRAMUSCULAR

## 2019-03-31 NOTE — Progress Notes (Signed)
Patient here today for initial visit regarding anemia.  

## 2019-04-01 LAB — CYTOLOGY - PAP
Comment: NEGATIVE
Diagnosis: NEGATIVE
High risk HPV: NEGATIVE

## 2019-04-01 NOTE — Progress Notes (Signed)
[facility]  [phone] [phone]) [phone]  ID: [name] OB: December 03, 1975  MR#: [id number]  [id number]  Patient Care Team: Delsa Grana, PA-C as PCP - General (Family Medicine)  CHIEF COMPLAINT: Iron deficiency anemia.  INTERVAL HISTORY: Patient is a 44 year old female who was noted to have a decreased hemoglobin and significantly decreased iron stores on routine blood work.  She continues to have heavy menses and is actively being evaluated by gynecology.  She currently feels well and is asymptomatic.  She remains active and works full-time.  She has no neurologic complaints.  She denies any recent fevers or illnesses.  She has a good appetite and denies weight loss.  She has no chest pain, shortness of breath, cough, or hemoptysis.  She denies any nausea, vomiting, constipation, or diarrhea.  She has no melena or hematochezia.  She has no urinary complaints.  Patient feels at her baseline and offers no specific complaint today.  REVIEW OF SYSTEMS:   Review of Systems  Constitutional: Negative.  Negative for fever, malaise/fatigue and weight loss.  Respiratory: Negative.  Negative for cough, hemoptysis and shortness of breath.   Cardiovascular: Negative.  Negative for chest pain and leg swelling.  Gastrointestinal: Negative.  Negative for abdominal pain, blood in stool and melena.  Genitourinary: Negative.  Negative for hematuria.  Musculoskeletal: Negative.  Negative for back pain.  Skin: Negative.  Negative for rash.  Neurological: Negative.  Negative for dizziness, focal weakness, weakness and headaches.  Psychiatric/Behavioral: Negative.  The patient is not nervous/anxious.     As per HPI. Otherwise, a complete review of systems is negative.  PAST MEDICAL HISTORY: Past Medical History:  Diagnosis Date  . Anemia   . Chest pain, unspecified     PAST SURGICAL HISTORY: Past Surgical History:  Procedure Laterality Date  . CESAREAN SECTION      x2    FAMILY HISTORY: Family History  Problem Relation Age of Onset  . Diabetes Father   . Hyperlipidemia Father   . Hypertension Father   . Cancer Father 53       colon  . Colon cancer Father     ADVANCED DIRECTIVES (Y/N):  N  HEALTH MAINTENANCE: Social History   Tobacco Use  . Smoking status: Never Smoker  . Smokeless tobacco: Never Used  Substance Use Topics  . Alcohol use: Yes    Comment: margaritas occasionally  . Drug use: No     Colonoscopy:  PAP:  Bone density:  Lipid panel:  No Known Allergies  Current Outpatient Medications  Medication Sig Dispense Refill  . Multiple Vitamins-Minerals (WOMENS ONE DAILY PO) Take by mouth.    . ferrous sulfate 325 (65 FE) MG EC tablet Take 325 mg by mouth 3 (three) times daily with meals.    . medroxyPROGESTERone (DEPO-PROVERA) 150 MG/ML injection Inject 1 mL (150 mg total) into the muscle every 3 (three) months. (Patient not taking: Reported on 03/31/2019) 1 mL 3   No current facility-administered medications for this visit.    OBJECTIVE: Vitals:   03/31/19 1509  BP: 140/84  Pulse: 73  Temp: (!) 96.4 F (35.8 C)  SpO2: 100%     Body mass index is 31.67 kg/m.    ECOG FS:0 - Asymptomatic  General: Well-developed, well-nourished, no acute distress. Eyes: Pink conjunctiva, anicteric sclera. HEENT: Normocephalic, moist mucous membranes. Lungs: No audible wheezing or coughing. Heart: Regular rate and rhythm. Abdomen: Soft, nontender, no obvious distention. Musculoskeletal: No edema, cyanosis, or clubbing. Neuro:  Alert, answering all questions appropriately. Cranial nerves grossly intact. Skin: No rashes or petechiae noted. Psych: Normal affect. Lymphatics: No cervical, calvicular, axillary or inguinal LAD.   LAB RESULTS:  Lab Results  Component Value Date   NA 137 03/02/2019   K 4.2 03/02/2019   CL 104 03/02/2019   CO2 26 03/02/2019   GLUCOSE 83 03/02/2019   BUN 11 03/02/2019   CREATININE 0.70  03/02/2019   CALCIUM 9.4 03/02/2019   PROT 7.3 03/02/2019   AST 15 03/02/2019   ALT 8 03/02/2019   BILITOT 0.3 03/02/2019   GFRNONAA 105 03/02/2019   GFRAA 121 03/02/2019    Lab Results  Component Value Date   WBC 5.7 03/02/2019   NEUTROABS 3,141 03/02/2019   HGB 9.4 (L) 03/02/2019   HCT 34.4 (L) 03/02/2019   MCV 68.3 (L) 03/02/2019   PLT 429 (H) 03/02/2019   Lab Results  Component Value Date   IRON 17 (L) 03/02/2019   TIBC 443 03/02/2019   IRONPCTSAT 4 (L) 03/02/2019   Lab Results  Component Value Date   FERRITIN <1 (L) 03/02/2019     STUDIES: No results found.  ASSESSMENT: Iron deficiency anemia secondary to chronic blood loss.  PLAN:    1.  Iron deficiency anemia: Likely secondary to chronic heavy menses.  Although patient continues to take oral iron supplementation, this is likely not enough to improve her iron stores her hemoglobin.  She has been instructed to continue her oral iron supplementation.  Return to clinic in 1 and 2 weeks to receive 510 mg IV Feraheme.  Patient will then return to clinic in 2 months for repeat laboratory work, further evaluation, and continuation of treatment if necessary. 2.  Heavy menses: Continue follow-up and treatment per gynecology. 3.  Thrombocytosis: Likely secondary to iron deficiency.  IV iron as above.  I spent a total of 45 minutes face-to-face with the patient and reviewing chart data of which greater than 50% of the visit was spent in counseling and coordination of care as detailed above.   Patient expressed understanding and was in agreement with this plan. She also understands that She can call clinic at any time with any questions, concerns, or complaints.    Lloyd Huger, MD   04/01/2019 6:24 AM

## 2019-04-07 ENCOUNTER — Telehealth: Payer: Self-pay | Admitting: Obstetrics and Gynecology

## 2019-04-07 ENCOUNTER — Inpatient Hospital Stay: Payer: No Typology Code available for payment source

## 2019-04-07 ENCOUNTER — Other Ambulatory Visit: Payer: Self-pay

## 2019-04-07 VITALS — BP 117/74 | HR 76 | Temp 98.0°F | Resp 20

## 2019-04-07 DIAGNOSIS — D500 Iron deficiency anemia secondary to blood loss (chronic): Secondary | ICD-10-CM

## 2019-04-07 MED ORDER — SODIUM CHLORIDE 0.9 % IV SOLN
510.0000 mg | Freq: Once | INTRAVENOUS | Status: AC
  Administered 2019-04-07: 510 mg via INTRAVENOUS
  Filled 2019-04-07: qty 510

## 2019-04-07 MED ORDER — SODIUM CHLORIDE 0.9 % IV SOLN
Freq: Once | INTRAVENOUS | Status: AC
  Filled 2019-04-07: qty 250

## 2019-04-07 NOTE — Telephone Encounter (Signed)
Lmtrc

## 2019-04-07 NOTE — Telephone Encounter (Signed)
-----   Message from Homero Fellers, MD sent at 03/31/2019  9:51 AM EST ----- Surgery Booking Request Patient Full Name:  Krista Garza  MRN: [id number]  DOB: [birth year]  Surgeon: Homero Fellers, MD  Requested Surgery Date and Time: ASAP Primary Diagnosis AND Code: thickened endometrium R93.8,  abnormal uterine bleeding N93.9 Secondary Diagnosis and Code:  Surgical Procedure: Hysteroscopy D&C L&D Notification: No Admission Status: same day surgery Length of Surgery: 1 hour Special Case Needs: No H&P: No Phone Interview???:  Yes Interpreter: No Language:  Medical Clearance:  No Special Scheduling Instructions: none Any known health/anesthesia issues, diabetes, sleep apnea, latex allergy, defibrillator/pacemaker?: No Acuity: P2   (P1 highest, P2 delay may cause harm, P3 low, elective gyn, P4 lowest)

## 2019-04-08 NOTE — Telephone Encounter (Signed)
Patient returned the call and is aware of limited OR scheduling as well as upcoming time off for Dr. Gilman Schmidt, and that I will call her as soon as I have availability. Patient said that was fine, and she didn't have a particular timeframe.

## 2019-04-14 ENCOUNTER — Inpatient Hospital Stay: Payer: No Typology Code available for payment source

## 2019-04-14 ENCOUNTER — Other Ambulatory Visit: Payer: Self-pay

## 2019-04-14 VITALS — BP 110/77 | HR 62 | Temp 98.1°F | Resp 18

## 2019-04-14 DIAGNOSIS — D500 Iron deficiency anemia secondary to blood loss (chronic): Secondary | ICD-10-CM

## 2019-04-14 MED ORDER — SODIUM CHLORIDE 0.9 % IV SOLN
510.0000 mg | Freq: Once | INTRAVENOUS | Status: AC
  Administered 2019-04-14: 14:00:00 510 mg via INTRAVENOUS
  Filled 2019-04-14: qty 17

## 2019-04-14 MED ORDER — SODIUM CHLORIDE 0.9 % IV SOLN
Freq: Once | INTRAVENOUS | Status: AC
  Filled 2019-04-14: qty 250

## 2019-04-28 NOTE — Telephone Encounter (Signed)
Attempted to reach the patient for scheduling. No phone connection.

## 2019-05-18 ENCOUNTER — Telehealth: Payer: Self-pay

## 2019-05-18 NOTE — Telephone Encounter (Signed)
Returned patients call to schedule her colonoscopy.  Referral has been closed and will need to be re-opened upon scheduling.  Colonoscopy referral is for Family History of Colon Cancer, Screening colonoscopy.  Thanks,  [place], [place]

## 2019-05-19 ENCOUNTER — Other Ambulatory Visit
Admission: RE | Admit: 2019-05-19 | Discharge: 2019-05-19 | Disposition: A | Discharge status: Home / Self Care | Payer: No Typology Code available for payment source | Attending: Family Medicine | Admitting: Family Medicine

## 2019-05-19 ENCOUNTER — Ambulatory Visit (INDEPENDENT_AMBULATORY_CARE_PROVIDER_SITE_OTHER): Payer: No Typology Code available for payment source | Admitting: Family Medicine

## 2019-05-19 ENCOUNTER — Other Ambulatory Visit: Payer: Self-pay

## 2019-05-19 ENCOUNTER — Telehealth: Payer: Self-pay

## 2019-05-19 ENCOUNTER — Encounter: Payer: Self-pay | Admitting: Family Medicine

## 2019-05-19 VITALS — BP 116/74 | HR 64 | Temp 97.3°F | Resp 16 | Ht 74.5 in | Wt 241.0 lb

## 2019-05-19 DIAGNOSIS — D649 Anemia, unspecified: Secondary | ICD-10-CM | POA: Diagnosis not present

## 2019-05-19 DIAGNOSIS — Z800 Family history of malignant neoplasm of digestive organs: Secondary | ICD-10-CM

## 2019-05-19 DIAGNOSIS — N939 Abnormal uterine and vaginal bleeding, unspecified: Secondary | ICD-10-CM | POA: Diagnosis not present

## 2019-05-19 DIAGNOSIS — Z1211 Encounter for screening for malignant neoplasm of colon: Secondary | ICD-10-CM

## 2019-05-19 DIAGNOSIS — N921 Excessive and frequent menstruation with irregular cycle: Secondary | ICD-10-CM | POA: Diagnosis not present

## 2019-05-19 DIAGNOSIS — N920 Excessive and frequent menstruation with regular cycle: Secondary | ICD-10-CM | POA: Insufficient documentation

## 2019-05-19 LAB — CBC
HCT: 38.3 % (ref 36.0–46.0)
Hemoglobin: 11.5 g/dL — ABNORMAL LOW (ref 12.0–15.0)
MCH: 23.9 pg — ABNORMAL LOW (ref 26.0–34.0)
MCHC: 30 g/dL (ref 30.0–36.0)
MCV: 79.6 fL — ABNORMAL LOW (ref 80.0–100.0)
Platelets: 332 10*3/uL (ref 150–400)
RBC: 4.81 MIL/uL (ref 3.87–5.11)
RDW: 24.2 % — ABNORMAL HIGH (ref 11.5–15.5)
WBC: 7.7 10*3/uL (ref 4.0–10.5)
nRBC: 0 % (ref 0.0–0.2)

## 2019-05-19 NOTE — Patient Instructions (Signed)
[facility] to get stat CBC

## 2019-05-19 NOTE — Telephone Encounter (Signed)
Gastroenterology Pre-Procedure Review  Request Date: 07/05/19 Requesting Physician: Dr. Vicente Males  PATIENT REVIEW QUESTIONS: The patient responded to the following health history questions as indicated:    1. Are you having any GI issues? no 2. Do you have a personal history of Polyps? no 3. Do you have a family history of Colon Cancer or Polyps? yes (father colon cancer) 4. Diabetes Mellitus? no 5. Joint replacements in the past 12 months?no 6. Major health problems in the past 3 months?no 7. Any artificial heart valves, MVP, or defibrillator?no    MEDICATIONS & ALLERGIES:    Patient reports the following regarding taking any anticoagulation/antiplatelet therapy:   Plavix, Coumadin, Eliquis, Xarelto, Lovenox, Pradaxa, Brilinta, or Effient? no Aspirin? no  Patient confirms/reports the following medications:  Current Outpatient Medications  Medication Sig Dispense Refill  . ferrous sulfate 325 (65 FE) MG EC tablet Take 325 mg by mouth 3 (three) times daily with meals.    . medroxyPROGESTERone (DEPO-PROVERA) 150 MG/ML injection Inject 1 mL (150 mg total) into the muscle every 3 (three) months. (Patient not taking: Reported on 03/31/2019) 1 mL 3  . Multiple Vitamins-Minerals (WOMENS ONE DAILY PO) Take by mouth.     No current facility-administered medications for this visit.    Patient confirms/reports the following allergies:  No Known Allergies  No orders of the defined types were placed in this encounter.   AUTHORIZATION INFORMATION Primary Insurance: 1D#: Group #:  Secondary Insurance: 1D#: Group #:  SCHEDULE INFORMATION: Date: 07/05/19 Time: Location:[facility]

## 2019-05-19 NOTE — Progress Notes (Signed)
Patient ID: Krista Garza, female    DOB: January 24, 1976, 44 y.o.   MRN: [id number]  PCP: Delsa Grana, PA-C  Chief Complaint  Patient presents with  . Follow-up    follow up on blurred vision    Subjective:   Krista Garza is a 44 y.o. female, presents to clinic with CC of the following:  heavy bleeding vaginal x 5 weeks has not been able to get into OBGYN - she did see them almost 2 months ago for same sx and for anemia, was given depo shot, she has appt early March, she is feeling fatigued, tired, hot flashes - worse since doing iron infusions (OB referred to Heme - reviewed last visits and infusions over the past 2 months) she denies SOB, rapid HR, CP, pallor, near syncope.    Last hemoglobin was 9.4, that was prior to the last 5 weeks of bleeding - soaking a tampon and pad every 2 hours and having large blood clots  Patient Active Problem List   Diagnosis Date Noted  . Iron deficiency anemia due to chronic blood loss 03/31/2019  . CHEST PAIN-UNSPECIFIED 08/29/2008      Current Outpatient Medications:  .  ferrous sulfate 325 (65 FE) MG EC tablet, Take 325 mg by mouth 3 (three) times daily with meals., Disp: , Rfl:  .  medroxyPROGESTERone (DEPO-PROVERA) 150 MG/ML injection, Inject 1 mL (150 mg total) into the muscle every 3 (three) months., Disp: 1 mL, Rfl: 3 .  Multiple Vitamins-Minerals (WOMENS ONE DAILY PO), Take by mouth., Disp: , Rfl:    No Known Allergies   Family History  Problem Relation Age of Onset  . Diabetes Father   . Hyperlipidemia Father   . Hypertension Father   . Cancer Father 71       colon  . Colon cancer Father      Social History   Socioeconomic History  . Marital status: Divorced    Spouse name: Not on file  . Number of children: 4  . Years of education: 88  . Highest education level: Associate degree: occupational, Hotel manager, or vocational program  Occupational History  . Occupation: Full time  Tobacco Use  . Smoking status: Never  Smoker  . Smokeless tobacco: Never Used  Substance and Sexual Activity  . Alcohol use: Yes    Comment: margaritas occasionally  . Drug use: No  . Sexual activity: Not Currently  Other Topics Concern  . Not on file  Social History Narrative   Divorced   Does not get regular exercise   Social Determinants of Health   Financial Resource Strain: Low Risk   . Difficulty of Paying Living Expenses: Not hard at all  Food Insecurity: No Food Insecurity  . Worried About Charity fundraiser in the Last Year: Never true  . Ran Out of Food in the Last Year: Never true  Transportation Needs: No Transportation Needs  . Lack of Transportation (Medical): No  . Lack of Transportation (Non-Medical): No  Physical Activity: Sufficiently Active  . Days of Exercise per Week: 2 days  . Minutes of Exercise per Session: 80 min  Stress: No Stress Concern Present  . Feeling of Stress : Not at all  Social Connections: Somewhat Isolated  . Frequency of Communication with Friends and Family: More than three times a week  . Frequency of Social Gatherings with Friends and Family: Never  . Attends Religious Services: More than 4 times per year  . Active  Member of Clubs or Organizations: No  . Attends Archivist Meetings: Never  . Marital Status: Divorced  Human resources officer Violence: Not At Risk  . Fear of Current or Ex-Partner: No  . Emotionally Abused: No  . Physically Abused: No  . Sexually Abused: No    Chart Review Today: Noted in HPI  Review of Systems 10 Systems reviewed and are negative for acute change except as noted in the HPI.     Objective:   Vitals:   05/19/19 0936  BP: 116/74  Pulse: 64  Resp: 16  Temp: (!) 97.3 F (36.3 C)  TempSrc: Temporal  SpO2: 99%  Weight: 241 lb (109.3 kg)  Height: 6' 2.5" (1.892 m)     Orthostatic VS for the past 24 hrs:  BP- Lying Pulse- Lying BP- Sitting Pulse- Sitting BP- Standing at 0 minutes Pulse- Standing at 0 minutes  05/19/19  1014 124/80 58 118/82 63 118/84 63    Orthostatics reviewed and are NEG    Body mass index is 31.29 kg/m.  Physical Exam Vitals and nursing note reviewed.  Constitutional:      General: She is not in acute distress.    Appearance: Normal appearance. She is well-developed. She is obese. She is not ill-appearing, toxic-appearing or diaphoretic.  HENT:     Head: Normocephalic and atraumatic.     Right Ear: External ear normal.     Left Ear: External ear normal.  Eyes:     General:        Right eye: No discharge.        Left eye: No discharge.     Conjunctiva/sclera: Conjunctivae normal.  Neck:     Trachea: No tracheal deviation.  Cardiovascular:     Rate and Rhythm: Normal rate and regular rhythm.     Pulses: Normal pulses.     Heart sounds: No murmur. No friction rub. No gallop.   Pulmonary:     Effort: Pulmonary effort is normal. No respiratory distress.     Breath sounds: No stridor.  Abdominal:     General: Bowel sounds are normal. There is no distension.     Palpations: There is no mass.     Tenderness: There is no abdominal tenderness. There is no guarding.  Musculoskeletal:        General: Normal range of motion.     Right lower leg: No edema.     Left lower leg: No edema.  Skin:    General: Skin is warm and dry.     Capillary Refill: Capillary refill takes less than 2 seconds.     Coloration: Skin is not pale.     Findings: No rash.  Neurological:     Mental Status: She is alert.     Motor: No abnormal muscle tone.     Coordination: Coordination normal.  Psychiatric:        Mood and Affect: Mood normal.        Behavior: Behavior normal.      Results for orders placed or performed in visit on 03/29/19  Cytology - PAP  Result Value Ref Range   High risk HPV Negative    Adequacy      Satisfactory for evaluation; transformation zone component PRESENT.   Diagnosis      - Negative for intraepithelial lesion or malignancy (NILM)   Comment Normal Reference  Range HPV - Negative         Assessment & Plan:  ICD-10-CM   1. Vaginal bleeding  N93.9 CBC with Differential/Platelet    CBC    CANCELED: CBC  2. Anemia, unspecified type  D64.9 CBC with Differential/Platelet    CBC    CANCELED: CBC     Stat CBC and pt instructed to wait for lab results in case she needs to go to the ER, but she is overall well appearing, possibly sx, but hemodynamically stable here, blood loss is not to the extent that I have to send her to the ER, I have reached out to provider Si Gaul at [facility] about her, she advised to not tx with any more hormone meds, they will get her in or order [place] today, agree with checking labs.    With pt from 9:50 to roughly 10:50 today - pt then sent for stat labs, and labs closely followed today and pt called with results immediately with plan. Greater than 50% of this visit was spent in direct face-to-face counseling, obtaining history and physical, discussing and educating pt on treatment plan.  Total time of this visit was 48.  Remainder of time involved but was not limited to reviewing chart (recent and pertinent OV notes and labs), documentation in EMR, and coordinating care and treatment plan, contacting OBGYN providers and coordinating her f/up with them.    Delsa Grana, PA-C 05/19/19 9:56 AM

## 2019-05-23 ENCOUNTER — Other Ambulatory Visit: Payer: Self-pay

## 2019-05-23 ENCOUNTER — Encounter: Payer: Self-pay | Admitting: Obstetrics and Gynecology

## 2019-05-23 ENCOUNTER — Ambulatory Visit (INDEPENDENT_AMBULATORY_CARE_PROVIDER_SITE_OTHER): Payer: No Typology Code available for payment source | Admitting: Obstetrics and Gynecology

## 2019-05-23 VITALS — BP 110/62 | Temp 96.7°F | Ht 74.5 in | Wt 242.0 lb

## 2019-05-23 DIAGNOSIS — N938 Other specified abnormal uterine and vaginal bleeding: Secondary | ICD-10-CM

## 2019-05-23 DIAGNOSIS — N852 Hypertrophy of uterus: Secondary | ICD-10-CM

## 2019-05-23 DIAGNOSIS — N939 Abnormal uterine and vaginal bleeding, unspecified: Secondary | ICD-10-CM

## 2019-05-23 MED ORDER — MEDROXYPROGESTERONE ACETATE 10 MG PO TABS: ORAL_TABLET | ORAL | 0 refills | Status: DC

## 2019-05-23 NOTE — Patient Instructions (Addendum)
Hysteroscopy Hysteroscopy is a procedure that is used to examine the inside of a woman's womb (uterus). This may be done for various reasons, including:  To look for lumps (tumors) and other growths in the uterus.  To evaluate abnormal bleeding, fibroid tumors, polyps, scar tissue (adhesions), or cancer of the uterus.  To determine the cause of an inability to get pregnant (infertility) or repeated losses of pregnancies (miscarriages).  To find a lost IUD (intrauterine device).  To perform a procedure that permanently prevents pregnancy (sterilization). During this procedure, a thin, flexible tube with a small light and camera (hysteroscope) is used to examine the uterus. The camera sends images to a monitor in the room so that your health care provider can view the inside of your uterus. A hysteroscopy should be done right after a menstrual period to make sure that you are not pregnant. Tell a health care provider about:  Any allergies you have.  All medicines you are taking, including vitamins, herbs, eye drops, creams, and over-the-counter medicines.  Any problems you or family members have had with the use of anesthetic medicines.  Any blood disorders you have.  Any surgeries you have had.  Any medical conditions you have.  Whether you are pregnant or may be pregnant. What are the risks? Generally, this is a safe procedure. However, problems may occur, including:  Excessive bleeding.  Infection.  Damage to the uterus or other structures or organs.  Allergic reaction to medicines or fluids that are used in the procedure. What happens before the procedure? Staying hydrated Follow instructions from your health care provider about hydration, which may include:  Up to 2 hours before the procedure - you may continue to drink clear liquids, such as water, clear fruit juice, black coffee, and plain tea. Eating and drinking restrictions Follow instructions from your health care  provider about eating and drinking, which may include:  8 hours before the procedure - stop eating solid foods and drink clear liquids only  2 hours before the procedure - stop drinking clear liquids. General instructions  Ask your health care provider about: ? Changing or stopping your normal medicines. This is important if you take diabetes medicines or blood thinners. ? Taking medicines such as aspirin and ibuprofen. These medicines can thin your blood and cause bleeding. Do not take these medicines for 1 week before your procedure, or as told by your health care provider.  Do not use any products that contain nicotine or tobacco for 2 weeks before the procedure. This includes cigarettes and e-cigarettes. If you need help quitting, ask your health care provider.  Medicine may be placed in your cervix the day before the procedure. This medicine causes the cervix to have a larger opening (dilate). The larger opening makes it easier for the hysteroscope to be inserted into the uterus during the procedure.  Plan to have someone with you for the first 24-48 hours after the procedure, especially if you are given a medicine to make you fall asleep (general anesthetic).  Plan to have someone take you home from the hospital or clinic. What happens during the procedure?  To lower your risk of infection: ? Your health care team will wash or sanitize their hands. ? Your skin will be washed with soap. ? Hair may be removed from the surgical area.  An IV tube will be inserted into one of your veins.  You may be given one or more of the following: ? A medicine to help  you relax (sedative). ? A medicine that numbs the area around the cervix (local anesthetic). ? A medicine to make you fall asleep (general anesthetic).  A hysteroscope will be inserted through your vagina and into your uterus.  Air or fluid will be used to enlarge your uterus, enabling your health care provider to see your uterus  better. The amount of fluid used will be carefully checked throughout the procedure.  In some cases, tissue may be gently scraped from inside the uterus and sent to a lab for testing (biopsy). The procedure may vary among health care providers and hospitals. What happens after the procedure?  Your blood pressure, heart rate, breathing rate, and blood oxygen level will be monitored until the medicines you were given have worn off.  You may have some cramping. You may be given medicines for this.  You may have bleeding, which varies from light spotting to menstrual-like bleeding. This is normal.  If you had a biopsy done, it is your responsibility to get the results of your procedure. Ask your health care provider, or the department performing the procedure, when your results will be ready. Summary  Hysteroscopy is a procedure that is used to examine the inside of a woman's womb (uterus).  After the procedure, you may have bleeding, which varies from light spotting to menstrual-like bleeding. This is normal. You may also have cramping.  Plan to have someone take you home from the hospital or clinic. This information is not intended to replace advice given to you by your health care provider. Make sure you discuss any questions you have with your health care provider. Document Revised: 02/20/2017 Document Reviewed: 04/08/2016 Elsevier Patient Education  [place].   Abnormal Uterine Bleeding Abnormal uterine bleeding is unusual bleeding from the uterus. It includes:  Bleeding or spotting between periods.  Bleeding after sex.  Bleeding that is heavier than normal.  Periods that last longer than usual.  Bleeding after menopause. Abnormal uterine bleeding can affect women at various stages in life, including teenagers, women in their reproductive years, pregnant women, and women who have reached menopause. Common causes of abnormal uterine bleeding include:  Pregnancy.   Growths of tissue (polyps).  A noncancerous tumor in the uterus (fibroid).  Infection.  Cancer.  Hormonal imbalances. Any type of abnormal bleeding should be evaluated by a health care provider. Many cases are minor and simple to treat, while others are more serious. Treatment will depend on the cause of the bleeding. Follow these instructions at home:  Monitor your condition for any changes.  Do not use tampons, douche, or have sex if told by your health care provider.  Change your pads often.  Get regular exams that include pelvic exams and cervical cancer screening.  Keep all follow-up visits as told by your health care provider. This is important. Contact a health care provider if:  Your bleeding lasts for more than one week.  You feel dizzy at times.  You feel nauseous or you vomit. Get help right away if:  You pass out.  Your bleeding soaks through a pad every hour.  You have abdominal pain.  You have a fever.  You become sweaty or weak.  You pass large blood clots from your vagina. Summary  Abnormal uterine bleeding is unusual bleeding from the uterus.  Any type of abnormal bleeding should be evaluated by a health care provider. Many cases are minor and simple to treat, while others are more serious.  Treatment will  depend on the cause of the bleeding. This information is not intended to replace advice given to you by your health care provider. Make sure you discuss any questions you have with your health care provider. Document Revised: 06/17/2017 Document Reviewed: 04/11/2016 Elsevier Patient Education  [place].

## 2019-05-23 NOTE — Progress Notes (Signed)
 Patient ID: Krista Garza, female   DOB: 06/13/1975, 44 y.o.   MRN: [id number]  Reason for Consult: Follow-up (Still bleeding very frequently with a lot of clotting )   Referred by Tapia, Leisa, PA-C  Subjective:     HPI:  Krista Garza is a 44 y.o. female she was seen previously for abnormal uterine bleeding and a thickened endometrium.  She reports that she has had continued abnormal uterine bleeding for the last 6 weeks.  Although it has gotten better the last 3 days.  She had a ultrasound at the beginning of February which showed continued thickened endometrium.  Endometrial biopsy in office was attempted although not successful secondary to cervical stenosis.  Surgical scheduling for hysteroscopy D&C was sent although patient has not been able to schedule this yet.  She would like to schedule this procedure today.  Past Medical History:  Diagnosis Date  . Anemia   . Chest pain, unspecified    Family History  Problem Relation Age of Onset  . Diabetes Father   . Hyperlipidemia Father   . Hypertension Father   . Cancer Father 40       colon  . Colon cancer Father    Past Surgical History:  Procedure Laterality Date  . CESAREAN SECTION     x2    Short Social History:  Social History   Tobacco Use  . Smoking status: Never Smoker  . Smokeless tobacco: Never Used  Substance Use Topics  . Alcohol use: Yes    Comment: margaritas occasionally    No Known Allergies  Current Outpatient Medications  Medication Sig Dispense Refill  . medroxyPROGESTERone (DEPO-PROVERA) 150 MG/ML injection Inject 1 mL (150 mg total) into the muscle every 3 (three) months. 1 mL 3  . Multiple Vitamins-Minerals (WOMENS ONE DAILY PO) Take by mouth.    . ferrous sulfate 325 (65 FE) MG EC tablet Take 325 mg by mouth 3 (three) times daily with meals.     No current facility-administered medications for this visit.    Review of Systems  Constitutional: Negative for chills, fatigue, fever and  unexpected weight change.  HENT: Negative for trouble swallowing.  Eyes: Negative for loss of vision.  Respiratory: Negative for cough, shortness of breath and wheezing.  Cardiovascular: Negative for chest pain, leg swelling, palpitations and syncope.  GI: Negative for abdominal pain, blood in stool, diarrhea, nausea and vomiting.  GU: Negative for difficulty urinating, dysuria, frequency and hematuria.  Musculoskeletal: Negative for back pain, leg pain and joint pain.  Skin: Negative for rash.  Neurological: Negative for dizziness, headaches, light-headedness, numbness and seizures.  Psychiatric: Negative for behavioral problem, confusion, depressed mood and sleep disturbance.        Objective:  Objective   Vitals:   05/23/19 0926  BP: 110/62  Temp: (!) 96.7 F (35.9 C)  Weight: 242 lb (109.8 kg)  Height: 6' 2.5" (1.892 m)   Body mass index is 30.66 kg/m.  Physical Exam Vitals and nursing note reviewed.  Constitutional:      Appearance: She is well-developed.  HENT:     Head: Normocephalic and atraumatic.  Eyes:     Pupils: Pupils are equal, round, and reactive to light.  Cardiovascular:     Rate and Rhythm: Normal rate and regular rhythm.  Pulmonary:     Effort: Pulmonary effort is normal. No respiratory distress.  Skin:    General: Skin is warm and dry.  Neurological:     Mental   Status: She is alert and oriented to person, place, and time.  Psychiatric:        Behavior: Behavior normal.        Thought Content: Thought content normal.        Judgment: Judgment normal.        Assessment/Plan:     44 year old with abnormal uterine bleeding and thickened endometrium Met with scheduler in office to schedule hysteroscopy D&C Will prescribe a Provera taper for the patient to take prior to procedure to help control bleeding  More than 15 minutes were spent face to face with the patient in the room with more than 50% of the time spent providing counseling and  discussing the plan of management.    Adrian Prows MD [facility], [facility] 05/23/2019 9:53 AM

## 2019-05-23 NOTE — Telephone Encounter (Signed)
Krista Garza scheduled the patient while in the office for an appointment. H&P was done today, and the patient will watch for MyChart notifications for Pre-admit testing. Patient was notified of COVID testing date and that she should quarantine.

## 2019-05-23 NOTE — H&P (View-Only) (Signed)
Patient ID: Krista Garza, female   DOB: 1976-01-01, 44 y.o.   MRN: [id number]  Reason for Consult: Follow-up (Still bleeding very frequently with a lot of clotting )   Referred by Delsa Grana, PA-C  Subjective:     HPI:  Krista Garza is a 44 y.o. female she was seen previously for abnormal uterine bleeding and a thickened endometrium.  She reports that she has had continued abnormal uterine bleeding for the last 6 weeks.  Although it has gotten better the last 3 days.  She had a ultrasound at the beginning of February which showed continued thickened endometrium.  Endometrial biopsy in office was attempted although not successful secondary to cervical stenosis.  Surgical scheduling for hysteroscopy D&C was sent although patient has not been able to schedule this yet.  She would like to schedule this procedure today.  Past Medical History:  Diagnosis Date  . Anemia   . Chest pain, unspecified    Family History  Problem Relation Age of Onset  . Diabetes Father   . Hyperlipidemia Father   . Hypertension Father   . Cancer Father 40       colon  . Colon cancer Father    Past Surgical History:  Procedure Laterality Date  . CESAREAN SECTION     x2    Short Social History:  Social History   Tobacco Use  . Smoking status: Never Smoker  . Smokeless tobacco: Never Used  Substance Use Topics  . Alcohol use: Yes    Comment: margaritas occasionally    No Known Allergies  Current Outpatient Medications  Medication Sig Dispense Refill  . medroxyPROGESTERone (DEPO-PROVERA) 150 MG/ML injection Inject 1 mL (150 mg total) into the muscle every 3 (three) months. 1 mL 3  . Multiple Vitamins-Minerals (WOMENS ONE DAILY PO) Take by mouth.    . ferrous sulfate 325 (65 FE) MG EC tablet Take 325 mg by mouth 3 (three) times daily with meals.     No current facility-administered medications for this visit.    Review of Systems  Constitutional: Negative for chills, fatigue, fever and  unexpected weight change.  HENT: Negative for trouble swallowing.  Eyes: Negative for loss of vision.  Respiratory: Negative for cough, shortness of breath and wheezing.  Cardiovascular: Negative for chest pain, leg swelling, palpitations and syncope.  GI: Negative for abdominal pain, blood in stool, diarrhea, nausea and vomiting.  GU: Negative for difficulty urinating, dysuria, frequency and hematuria.  Musculoskeletal: Negative for back pain, leg pain and joint pain.  Skin: Negative for rash.  Neurological: Negative for dizziness, headaches, light-headedness, numbness and seizures.  Psychiatric: Negative for behavioral problem, confusion, depressed mood and sleep disturbance.        Objective:  Objective   Vitals:   05/23/19 0926  BP: 110/62  Temp: (!) 96.7 F (35.9 C)  Weight: 242 lb (109.8 kg)  Height: 6' 2.5" (1.892 m)   Body mass index is 30.66 kg/m.  Physical Exam Vitals and nursing note reviewed.  Constitutional:      Appearance: She is well-developed.  HENT:     Head: Normocephalic and atraumatic.  Eyes:     Pupils: Pupils are equal, round, and reactive to light.  Cardiovascular:     Rate and Rhythm: Normal rate and regular rhythm.  Pulmonary:     Effort: Pulmonary effort is normal. No respiratory distress.  Skin:    General: Skin is warm and dry.  Neurological:     Mental  Status: She is alert and oriented to person, place, and time.  Psychiatric:        Behavior: Behavior normal.        Thought Content: Thought content normal.        Judgment: Judgment normal.        Assessment/Plan:     44 year old with abnormal uterine bleeding and thickened endometrium Met with scheduler in office to schedule hysteroscopy D&C Will prescribe a Provera taper for the patient to take prior to procedure to help control bleeding  More than 15 minutes were spent face to face with the patient in the room with more than 50% of the time spent providing counseling and  discussing the plan of management.    Adrian Prows MD [facility] 05/23/2019 9:53 AM

## 2019-05-24 ENCOUNTER — Telehealth: Payer: Self-pay | Admitting: Obstetrics and Gynecology

## 2019-05-24 NOTE — Telephone Encounter (Signed)
Adv pt to contact her insurance company to add [facility] as a referral.  I called for pre-auth and was told that pt needed to add our practice and Dr Gilman Schmidt.   She will let me know once it's done so that I can proceed with obtaining the pre-auth.

## 2019-05-29 NOTE — Progress Notes (Signed)
[facility]  Telephone:([phone]) [phone] Fax:(336) [phone]  ID: [name] OB: [birth year]  MR#: [id number]  SN:[id number]  Patient Care Team: Delsa Grana, PA-C as PCP - General (Family Medicine)  CHIEF COMPLAINT: Iron deficiency anemia.  INTERVAL HISTORY: Patient returns to clinic today for repeat laboratory work, further evaluation and continuation of IV Feraheme.  She feels mildly improved since receiving her infusions several months ago, but still complains of increased weakness and fatigue.  Despite this, she remains active and continues to work full-time.  She has no neurologic complaints.  She denies any recent fevers or illnesses.  She has a good appetite and denies weight loss.  She has no chest pain, shortness of breath, cough, or hemoptysis.  She denies any nausea, vomiting, constipation, or diarrhea.  She has no melena or hematochezia.  She has no urinary complaints.  Patient offers no further specific complaints today.  REVIEW OF SYSTEMS:   Review of Systems  Constitutional: Positive for malaise/fatigue. Negative for fever and weight loss.  Respiratory: Negative.  Negative for cough, hemoptysis and shortness of breath.   Cardiovascular: Negative.  Negative for chest pain and leg swelling.  Gastrointestinal: Negative.  Negative for abdominal pain, blood in stool and melena.  Genitourinary: Negative.  Negative for hematuria.  Musculoskeletal: Negative.  Negative for back pain.  Skin: Negative.  Negative for rash.  Neurological: Positive for weakness. Negative for dizziness, focal weakness and headaches.  Psychiatric/Behavioral: Negative.  The patient is not nervous/anxious.     As per HPI. Otherwise, a complete review of systems is negative.  PAST MEDICAL HISTORY: Past Medical History:  Diagnosis Date  . Anemia   . Chest pain, unspecified     PAST SURGICAL HISTORY: Past Surgical History:  Procedure Laterality Date  . CESAREAN SECTION     x2     FAMILY HISTORY: Family History  Problem Relation Age of Onset  . Diabetes Father   . Hyperlipidemia Father   . Hypertension Father   . Cancer Father 41       colon  . Colon cancer Father     ADVANCED DIRECTIVES (Y/N):  N  HEALTH MAINTENANCE: Social History   Tobacco Use  . Smoking status: Never Smoker  . Smokeless tobacco: Never Used  Substance Use Topics  . Alcohol use: Yes    Comment: margaritas occasionally  . Drug use: No     Colonoscopy:  PAP:  Bone density:  Lipid panel:  No Known Allergies  Current Outpatient Medications  Medication Sig Dispense Refill  . medroxyPROGESTERone (PROVERA) 10 MG tablet Take 2 tablets (20 mg total) by mouth in the morning, at noon, and at bedtime for 7 days, THEN 2 tablets (20 mg total) in the morning and at bedtime for 7 days, THEN 2 tablets (20 mg total) daily for 7 days, THEN 1 tablet (10 mg total) daily. 114 tablet 0  . Multiple Vitamins-Minerals (WOMENS ONE DAILY PO) Take by mouth.    . ferrous sulfate 325 (65 FE) MG EC tablet Take 325 mg by mouth 3 (three) times daily with meals.    . medroxyPROGESTERone (DEPO-PROVERA) 150 MG/ML injection Inject 1 mL (150 mg total) into the muscle every 3 (three) months. (Patient not taking: Reported on 06/03/2019) 1 mL 3   No current facility-administered medications for this visit.    OBJECTIVE: Vitals:   06/03/19 1310  BP: (!) 139/95  Pulse: 74  Resp: 18  Temp: (!) 97.5 F (36.4 C)  SpO2: 100%  Body mass index is 31.15 kg/m.    ECOG FS:0 - Asymptomatic  General: Well-developed, well-nourished, no acute distress. Eyes: Pink conjunctiva, anicteric sclera. HEENT: Normocephalic, moist mucous membranes. Lungs: No audible wheezing or coughing. Heart: Regular rate and rhythm. Abdomen: Soft, nontender, no obvious distention. Musculoskeletal: No edema, cyanosis, or clubbing. Neuro: Alert, answering all questions appropriately. Cranial nerves grossly intact. Skin: No rashes or  petechiae noted. Psych: Normal affect.   LAB RESULTS:  Lab Results  Component Value Date   NA 137 03/02/2019   K 4.2 03/02/2019   CL 104 03/02/2019   CO2 26 03/02/2019   GLUCOSE 83 03/02/2019   BUN 11 03/02/2019   CREATININE 0.70 03/02/2019   CALCIUM 9.4 03/02/2019   PROT 7.3 03/02/2019   AST 15 03/02/2019   ALT 8 03/02/2019   BILITOT 0.3 03/02/2019   GFRNONAA 105 03/02/2019   GFRAA 121 03/02/2019    Lab Results  Component Value Date   WBC 7.3 06/02/2019   NEUTROABS 4.0 06/02/2019   HGB 11.8 (L) 06/02/2019   HCT 38.1 06/02/2019   MCV 78.7 (L) 06/02/2019   PLT 386 06/02/2019   Lab Results  Component Value Date   IRON 16 (L) 06/02/2019   TIBC 360 06/02/2019   IRONPCTSAT 4 (L) 06/02/2019   Lab Results  Component Value Date   FERRITIN 6 (L) 06/02/2019     STUDIES: No results found.  ASSESSMENT: Iron deficiency anemia secondary to chronic blood loss.  PLAN:    1.  Iron deficiency anemia: Likely secondary to chronic heavy menses.  Patient's hemoglobin and iron stores have improved, but still decreased.  She also remains symptomatic.  Continue oral iron supplementation.  Proceed with 510 mg IV Feraheme today.  Return to clinic in 1 week for second infusion.  Patient will then return to clinic in 3 months with repeat laboratory work, further evaluation, and continuation of treatment if needed.   2.  Heavy menses: Continue follow-up and treatment per gynecology. 3.  Thrombocytosis: Resolved.  I spent a total of 30 minutes reviewing chart data, face-to-face evaluation with the patient, counseling and coordination of care as detailed above.   Patient expressed understanding and was in agreement with this plan. She also understands that She can call clinic at any time with any questions, concerns, or complaints.    Lloyd Huger, MD   06/03/2019 1:46 PM

## 2019-05-31 ENCOUNTER — Ambulatory Visit: Payer: No Typology Code available for payment source | Admitting: Obstetrics and Gynecology

## 2019-05-31 ENCOUNTER — Ambulatory Visit: Payer: No Typology Code available for payment source | Admitting: Family Medicine

## 2019-06-02 ENCOUNTER — Inpatient Hospital Stay: Payer: No Typology Code available for payment source | Attending: Oncology

## 2019-06-02 ENCOUNTER — Other Ambulatory Visit: Payer: Self-pay

## 2019-06-02 DIAGNOSIS — Z8249 Family history of ischemic heart disease and other diseases of the circulatory system: Secondary | ICD-10-CM | POA: Diagnosis not present

## 2019-06-02 DIAGNOSIS — Z800 Family history of malignant neoplasm of digestive organs: Secondary | ICD-10-CM | POA: Diagnosis not present

## 2019-06-02 DIAGNOSIS — Z833 Family history of diabetes mellitus: Secondary | ICD-10-CM | POA: Diagnosis not present

## 2019-06-02 DIAGNOSIS — R5383 Other fatigue: Secondary | ICD-10-CM | POA: Diagnosis not present

## 2019-06-02 DIAGNOSIS — N920 Excessive and frequent menstruation with regular cycle: Secondary | ICD-10-CM | POA: Diagnosis not present

## 2019-06-02 DIAGNOSIS — D500 Iron deficiency anemia secondary to blood loss (chronic): Secondary | ICD-10-CM

## 2019-06-02 DIAGNOSIS — Z793 Long term (current) use of hormonal contraceptives: Secondary | ICD-10-CM | POA: Insufficient documentation

## 2019-06-02 DIAGNOSIS — R531 Weakness: Secondary | ICD-10-CM | POA: Diagnosis not present

## 2019-06-02 LAB — CBC WITH DIFFERENTIAL/PLATELET
Abs Immature Granulocytes: 0.02 10*3/uL (ref 0.00–0.07)
Basophils Absolute: 0.1 10*3/uL (ref 0.0–0.1)
Basophils Relative: 1 %
Eosinophils Absolute: 0.4 10*3/uL (ref 0.0–0.5)
Eosinophils Relative: 5 %
HCT: 38.1 % (ref 36.0–46.0)
Hemoglobin: 11.8 g/dL — ABNORMAL LOW (ref 12.0–15.0)
Immature Granulocytes: 0 %
Lymphocytes Relative: 31 %
Lymphs Abs: 2.3 10*3/uL (ref 0.7–4.0)
MCH: 24.4 pg — ABNORMAL LOW (ref 26.0–34.0)
MCHC: 31 g/dL (ref 30.0–36.0)
MCV: 78.7 fL — ABNORMAL LOW (ref 80.0–100.0)
Monocytes Absolute: 0.6 10*3/uL (ref 0.1–1.0)
Monocytes Relative: 8 %
Neutro Abs: 4 10*3/uL (ref 1.7–7.7)
Neutrophils Relative %: 55 %
Platelets: 386 10*3/uL (ref 150–400)
RBC: 4.84 MIL/uL (ref 3.87–5.11)
RDW: 21 % — ABNORMAL HIGH (ref 11.5–15.5)
WBC: 7.3 10*3/uL (ref 4.0–10.5)
nRBC: 0 % (ref 0.0–0.2)

## 2019-06-02 LAB — IRON AND TIBC
Iron: 16 ug/dL — ABNORMAL LOW (ref 28–170)
Saturation Ratios: 4 % — ABNORMAL LOW (ref 10.4–31.8)
TIBC: 360 ug/dL (ref 250–450)
UIBC: 344 ug/dL

## 2019-06-02 LAB — FERRITIN: Ferritin: 6 ng/mL — ABNORMAL LOW (ref 11–307)

## 2019-06-03 ENCOUNTER — Inpatient Hospital Stay: Payer: No Typology Code available for payment source

## 2019-06-03 ENCOUNTER — Encounter: Payer: Self-pay | Admitting: Oncology

## 2019-06-03 ENCOUNTER — Inpatient Hospital Stay (HOSPITAL_BASED_OUTPATIENT_CLINIC_OR_DEPARTMENT_OTHER): Payer: No Typology Code available for payment source | Admitting: Oncology

## 2019-06-03 VITALS — BP 139/95 | HR 74 | Temp 97.5°F | Resp 18 | Wt 245.9 lb

## 2019-06-03 DIAGNOSIS — D500 Iron deficiency anemia secondary to blood loss (chronic): Secondary | ICD-10-CM

## 2019-06-03 MED ORDER — SODIUM CHLORIDE 0.9 % IV SOLN
510.0000 mg | Freq: Once | INTRAVENOUS | Status: AC
  Administered 2019-06-03: 510 mg via INTRAVENOUS
  Filled 2019-06-03: qty 510

## 2019-06-03 MED ORDER — SODIUM CHLORIDE 0.9 % IV SOLN
INTRAVENOUS | Status: DC
  Filled 2019-06-03: qty 250

## 2019-06-10 ENCOUNTER — Other Ambulatory Visit: Payer: Self-pay

## 2019-06-10 ENCOUNTER — Inpatient Hospital Stay: Admission: RE | Admit: 2019-06-10 | Payer: No Typology Code available for payment source | Source: Ambulatory Visit

## 2019-06-10 ENCOUNTER — Inpatient Hospital Stay: Payer: No Typology Code available for payment source

## 2019-06-10 VITALS — BP 118/78 | HR 73 | Temp 96.1°F | Resp 18

## 2019-06-10 DIAGNOSIS — D500 Iron deficiency anemia secondary to blood loss (chronic): Secondary | ICD-10-CM | POA: Diagnosis not present

## 2019-06-10 MED ORDER — SODIUM CHLORIDE 0.9 % IV SOLN
510.0000 mg | Freq: Once | INTRAVENOUS | Status: AC
  Administered 2019-06-10: 510 mg via INTRAVENOUS
  Filled 2019-06-10: qty 510

## 2019-06-10 MED ORDER — SODIUM CHLORIDE 0.9 % IV SOLN
INTRAVENOUS | Status: DC
  Filled 2019-06-10: qty 250

## 2019-06-14 ENCOUNTER — Encounter
Admission: RE | Admit: 2019-06-14 | Discharge: 2019-06-14 | Disposition: A | Discharge status: Home / Self Care | Payer: No Typology Code available for payment source | Source: Ambulatory Visit | Attending: Obstetrics and Gynecology | Admitting: Obstetrics and Gynecology

## 2019-06-14 NOTE — Patient Instructions (Signed)
INSTRUCTIONS FOR SURGERY     Your surgery is scheduled for:   Tuesday, MARCH 30TH     To find out your arrival time for the day of surgery,          please call [phone] between 1 pm and 3 pm on :  Monday, MARCH 29TH     When you arrive for surgery, report to the [facility].       Do NOT stop on the first floor to register.    REMEMBER: Instructions that are not followed completely may result in serious medical risk,  up to and including death, or upon the discretion of your surgeon and anesthesiologist,            your surgery may need to be rescheduled.  __X__ 1. Do not eat food after midnight the night before your procedure.                    No gum, candy, lozenger, tic tacs, tums or hard candies.                  ABSOLUTELY NOTHING SOLID IN YOUR MOUTH AFTER MIDNIGHT                    You may drink unlimited clear liquids up to 2 hours before you are scheduled to arrive for surgery.                   Do not drink anything within those 2 hours unless you need to take medicine, then take the                   smallest amount you need.  Clear liquids include:  water, apple juice without pulp,                   any flavor Gatorade, Black coffee, black tea.  Sugar may be added but no dairy/ honey /lemon.                        Broth and jello is not considered a clear liquid.  __x__  2. On the morning of surgery, please brush your teeth with toothpaste and water. You may rinse with                  mouthwash if you wish but DO NOT SWALLOW TOOTHPASTE OR MOUTHWASH  __X___3. NO alcohol for 24 hours before or after surgery.  __x___ 4.  Do NOT smoke or use e-cigarettes for 24 HOURS PRIOR TO SURGERY.                      DO NOT Use any chewable tobacco products for at least 6 hours prior to surgery.  __x___ 5. If you start any new medication after this appointment and prior to surgery, please    Bring it with you on the day of surgery.  ___x__ 6. Notify your doctor if there is any change in your medical condition, such as fever,  infection, vomitting, diarrhea or any open sores.  __x___ 7.  USE the CHG SOAP as instructed, the night before surgery and the day of surgery.                   Once you have washed with this soap, do NOT use any of the following: Powders, perfumes                    or lotions. Please do not wear make up, hairpins, clips or nail polish. You MAY wear deodorant.                   Men may shave their face and neck.  Women need to shave 48 hours prior to surgery.                   DO NOT wear ANY jewelry on the day of surgery. If there are rings that are too tight to                    remove easily, please address this prior to the surgery day. Piercings need to be removed.                                                                     NO METAL ON YOUR BODY.                    Do NOT bring any valuables.  If you came to Pre-Admit testing then you will not need license,                     insurance card or credit card.  If you will be staying overnight, please either leave your things in                     the car or have your family be responsible for these items.                     [facility] IS NOT RESPONSIBLE FOR BELONGINGS OR VALUABLES.  ___X__ 8. DO NOT wear contact lenses on surgery day.  You may not have dentures,                     Hearing aides, contacts or glasses in the operating room. These items can be                    Placed in the Recovery Room to receive immediately after surgery.  __x___ 9. IF YOU ARE SCHEDULED TO GO HOME ON THE SAME DAY, YOU MUST                   Have someone to drive you home and to stay with you  for the first 24 hours.                    Have an arrangement prior to arriving on surgery day.  ___x__ 10. Take the following medications on the morning of surgery with a sip of water:  1.  NOTHING                     2.                     3.                                                 _X____ 11.  Follow any instructions provided to you by your surgeon.                        Such as enema, clear liquid bowel prep                       ##PLEASE DRINK THE PRESURGICAL CARBOHYDRATE DRINK.                        HAVE IT COMPLETED 2 HOURS PRIOR TO ARRIVAL AT HOSPITAL.  __X__  12. STOP ALL ASPIRIN PRODUCTS AS OF TODAY, MARCH 23RD                       THIS INCLUDES BC POWDERS / GOODIES POWDER  __x___ 13. STOP Anti-inflammatories as of: TODAY, MARCH 23RD                      This includes IBUPROFEN / MOTRIN / ADVIL / ALEVE/ NAPROXYN                    YOU MAY TAKE TYLENOL ANY TIME PRIOR TO SURGERY.  _X____ 14.  Stop supplements until after surgery.                     This includes:  MULTIVITAMINS                 You may continue taking Vitamin B12 / Vitamin D3 but do not take on the morning of surgery.  ______18    Wear clean and comfortable clothing to the hospital.  BRING PHONE NUMBERS FOR ALL YOUR CONTACT PEOPLE.

## 2019-06-14 NOTE — Pre-Procedure Instructions (Signed)
Left voicemail message x 2 for preop interview. Asked patient to return the call to the preadmit testing office.

## 2019-06-15 ENCOUNTER — Encounter: Admission: RE | Admit: 2019-06-15 | Payer: No Typology Code available for payment source | Source: Ambulatory Visit

## 2019-06-16 ENCOUNTER — Other Ambulatory Visit: Payer: Self-pay

## 2019-06-16 ENCOUNTER — Encounter
Admission: RE | Admit: 2019-06-16 | Discharge: 2019-06-16 | Disposition: A | Discharge status: Home / Self Care | Payer: No Typology Code available for payment source | Source: Ambulatory Visit | Attending: Obstetrics and Gynecology | Admitting: Obstetrics and Gynecology

## 2019-06-16 DIAGNOSIS — Z01812 Encounter for preprocedural laboratory examination: Secondary | ICD-10-CM | POA: Insufficient documentation

## 2019-06-16 NOTE — Patient Instructions (Signed)
INSTRUCTIONS FOR SURGERY     Your surgery is scheduled for:   Tuesday, MARCH 30TH     To find out your arrival time for the day of surgery,          please call [phone] between 1 pm and 3 pm on :  Monday, MARCH 29TH     When you arrive for surgery, report to the [facility].       Do NOT stop on the first floor to register.    REMEMBER: Instructions that are not followed completely may result in serious medical risk,  up to and including death, or upon the discretion of your surgeon and anesthesiologist,            your surgery may need to be rescheduled.  __X__ 1. Do not eat food after midnight the night before your procedure.                    No gum, candy, lozenger, tic tacs, tums or hard candies.                  ABSOLUTELY NOTHING SOLID IN YOUR MOUTH AFTER MIDNIGHT                    You may drink unlimited clear liquids up to 2 hours before you are scheduled to arrive for surgery.                   Do not drink anything within those 2 hours unless you need to take medicine, then take the                   smallest amount you need.  Clear liquids include:  water, apple juice without pulp,                   any flavor Gatorade, Black coffee, black tea.  Sugar may be added but no dairy/ honey /lemon.                        Broth and jello is not considered a clear liquid.  __x__  2. On the morning of surgery, please brush your teeth with toothpaste and water. You may rinse with                  mouthwash if you wish but DO NOT SWALLOW TOOTHPASTE OR MOUTHWASH  __X___3. NO alcohol for 24 hours before or after surgery.  __x___ 4.  Do NOT smoke or use e-cigarettes for 24 HOURS PRIOR TO SURGERY.                      DO NOT Use any chewable tobacco products for at least 6 hours prior to surgery.  __x___ 5. If you start any new medication after this appointment and prior to surgery, please    Bring it with you on the day of surgery.  ___x__ 6. Notify your doctor if there is any change in your medical condition, such as fever,  infection, vomitting,  diarrhea or any open sores.  __x___ 7.  USE ANTIBACTERIAL  SOAP as instructed, the night before surgery and the day of surgery.                   Once you have washed with this soap, do NOT use any of the following: Powders, perfumes                    or lotions. Please do not wear make up, hairpins, clips or nail polish. You MAY wear deodorant.                   Men may shave their face and neck.  Women need to shave 48 hours prior to surgery.                   DO NOT wear ANY jewelry on the day of surgery. If there are rings that are too tight to                    remove easily, please address this prior to the surgery day. Piercings need to be removed.                                                                     NO METAL ON YOUR BODY.                    Do NOT bring any valuables.  If you came to Pre-Admit testing then you will not need license,                     insurance card or credit card.  If you will be staying overnight, please either leave your things in                     the car or have your family be responsible for these items.                     [facility] IS NOT RESPONSIBLE FOR BELONGINGS OR VALUABLES.  ___X__ 8. DO NOT wear contact lenses on surgery day.  You may not have dentures,                     Hearing aides, contacts or glasses in the operating room. These items can be                    Placed in the Recovery Room to receive immediately after surgery.  __x___ 9. IF YOU ARE SCHEDULED TO GO HOME ON THE SAME DAY, YOU MUST                   Have someone to drive you home and to stay with you  for the first 24 hours.                    Have an arrangement prior to arriving on surgery day.  ___x__ 10. Take the following medications on the morning of surgery with a sip of water:  1.  PROGESTERONE                     2.                                                   __X___ 11.  Follow any instructions provided to you by your surgeon.                        Such as enema, clear liquid bowel prep                        ##PLEASE DRINK THE PRESURGICAL CARBOHYDRATE                         DRINK IN YOUR BAG. HAVE IT COMPLETED BY 2 HOURS                         PRIOR TO ARRIVAL TO THE HOSPITAL.##                 __X__  12. STOP ALL ASPIRIN PRODUCTS AS OF TODAY, MARCH 25TH                       THIS INCLUDES BC POWDERS / GOODIES POWDER  __x___ 13. STOP Anti-inflammatories as of: TODAY, MARCH 25TH                      This includes IBUPROFEN / MOTRIN / ADVIL / ALEVE/ NAPROXYN                    YOU MAY TAKE TYLENOL ANY TIME PRIOR TO SURGERY.  ___X___18.  Wear clean and comfortable clothing to the hospital.

## 2019-06-17 ENCOUNTER — Other Ambulatory Visit
Admission: RE | Admit: 2019-06-17 | Discharge: 2019-06-17 | Disposition: A | Discharge status: Home / Self Care | Payer: No Typology Code available for payment source | Source: Ambulatory Visit | Attending: Obstetrics and Gynecology | Admitting: Obstetrics and Gynecology

## 2019-06-17 DIAGNOSIS — Z01812 Encounter for preprocedural laboratory examination: Secondary | ICD-10-CM | POA: Diagnosis not present

## 2019-06-17 DIAGNOSIS — Z20822 Contact with and (suspected) exposure to covid-19: Secondary | ICD-10-CM | POA: Diagnosis not present

## 2019-06-17 LAB — CBC
HCT: 38.6 % (ref 36.0–46.0)
Hemoglobin: 11.8 g/dL — ABNORMAL LOW (ref 12.0–15.0)
MCH: 26 pg (ref 26.0–34.0)
MCHC: 30.6 g/dL (ref 30.0–36.0)
MCV: 85 fL (ref 80.0–100.0)
Platelets: 331 10*3/uL (ref 150–400)
RBC: 4.54 MIL/uL (ref 3.87–5.11)
RDW: 23.4 % — ABNORMAL HIGH (ref 11.5–15.5)
WBC: 6.8 10*3/uL (ref 4.0–10.5)
nRBC: 0 % (ref 0.0–0.2)

## 2019-06-17 LAB — TYPE AND SCREEN
ABO/RH(D): B POS
Antibody Screen: NEGATIVE

## 2019-06-18 LAB — SARS CORONAVIRUS 2 (TAT 6-24 HRS): SARS Coronavirus 2: NEGATIVE

## 2019-06-21 ENCOUNTER — Encounter: Payer: Self-pay | Admitting: Obstetrics and Gynecology

## 2019-06-21 ENCOUNTER — Other Ambulatory Visit: Payer: Self-pay

## 2019-06-21 ENCOUNTER — Ambulatory Visit: Payer: No Typology Code available for payment source | Admitting: Anesthesiology

## 2019-06-21 ENCOUNTER — Ambulatory Visit
Admission: RE | Admit: 2019-06-21 | Discharge: 2019-06-21 | Disposition: A | Discharge status: Home / Self Care | Payer: No Typology Code available for payment source | Attending: Obstetrics and Gynecology | Admitting: Obstetrics and Gynecology

## 2019-06-21 ENCOUNTER — Encounter
Admission: RE | Disposition: A | Discharge status: Home / Self Care | Payer: Self-pay | Source: Home / Self Care | Attending: Obstetrics and Gynecology

## 2019-06-21 DIAGNOSIS — N840 Polyp of corpus uteri: Secondary | ICD-10-CM | POA: Insufficient documentation

## 2019-06-21 DIAGNOSIS — Z8249 Family history of ischemic heart disease and other diseases of the circulatory system: Secondary | ICD-10-CM | POA: Insufficient documentation

## 2019-06-21 DIAGNOSIS — Z8349 Family history of other endocrine, nutritional and metabolic diseases: Secondary | ICD-10-CM | POA: Insufficient documentation

## 2019-06-21 DIAGNOSIS — N939 Abnormal uterine and vaginal bleeding, unspecified: Secondary | ICD-10-CM | POA: Diagnosis not present

## 2019-06-21 DIAGNOSIS — Z800 Family history of malignant neoplasm of digestive organs: Secondary | ICD-10-CM | POA: Diagnosis not present

## 2019-06-21 DIAGNOSIS — N852 Hypertrophy of uterus: Secondary | ICD-10-CM | POA: Diagnosis not present

## 2019-06-21 DIAGNOSIS — Z793 Long term (current) use of hormonal contraceptives: Secondary | ICD-10-CM | POA: Diagnosis not present

## 2019-06-21 DIAGNOSIS — R9389 Abnormal findings on diagnostic imaging of other specified body structures: Secondary | ICD-10-CM | POA: Diagnosis not present

## 2019-06-21 DIAGNOSIS — Z833 Family history of diabetes mellitus: Secondary | ICD-10-CM | POA: Insufficient documentation

## 2019-06-21 DIAGNOSIS — N938 Other specified abnormal uterine and vaginal bleeding: Secondary | ICD-10-CM | POA: Diagnosis not present

## 2019-06-21 HISTORY — PX: HYSTEROSCOPY WITH D & C: SHX1775

## 2019-06-21 LAB — POCT PREGNANCY, URINE: Preg Test, Ur: NEGATIVE

## 2019-06-21 LAB — ABO/RH: ABO/RH(D): B POS

## 2019-06-21 SURGERY — DILATATION AND CURETTAGE /HYSTEROSCOPY
Anesthesia: General

## 2019-06-21 MED ORDER — EPHEDRINE SULFATE 50 MG/ML IJ SOLN
INTRAMUSCULAR | Status: DC | PRN
  Administered 2019-06-21: 10 mg via INTRAVENOUS

## 2019-06-21 MED ORDER — LACTATED RINGERS IV SOLN: INTRAVENOUS | Status: DC

## 2019-06-21 MED ORDER — FENTANYL CITRATE (PF) 100 MCG/2ML IJ SOLN
INTRAMUSCULAR | Status: DC | PRN
  Administered 2019-06-21: 50 ug via INTRAVENOUS

## 2019-06-21 MED ORDER — DEXAMETHASONE SODIUM PHOSPHATE 10 MG/ML IJ SOLN
INTRAMUSCULAR | Status: DC | PRN
  Administered 2019-06-21: 10 mg via INTRAVENOUS

## 2019-06-21 MED ORDER — FAMOTIDINE 20 MG PO TABS
20.0000 mg | ORAL_TABLET | Freq: Once | ORAL | Status: AC
  Administered 2019-06-21: 20 mg via ORAL

## 2019-06-21 MED ORDER — ACETAMINOPHEN 500 MG PO TABS: 1000.0000 mg | ORAL_TABLET | Freq: Four times a day (QID) | ORAL | 0 refills | Status: DC | PRN

## 2019-06-21 MED ORDER — OXYCODONE HCL 5 MG PO TABS: 5.0000 mg | ORAL_TABLET | Freq: Once | ORAL | Status: DC | PRN

## 2019-06-21 MED ORDER — MIDAZOLAM HCL 2 MG/2ML IJ SOLN
INTRAMUSCULAR | Status: DC | PRN
  Administered 2019-06-21: 2 mg via INTRAVENOUS

## 2019-06-21 MED ORDER — IBUPROFEN 800 MG PO TABS: 800.0000 mg | ORAL_TABLET | Freq: Three times a day (TID) | ORAL | 0 refills | Status: DC | PRN

## 2019-06-21 MED ORDER — FENTANYL CITRATE (PF) 100 MCG/2ML IJ SOLN
INTRAMUSCULAR | Status: AC
  Filled 2019-06-21: qty 2

## 2019-06-21 MED ORDER — PROPOFOL 10 MG/ML IV BOLUS
INTRAVENOUS | Status: DC | PRN
  Administered 2019-06-21: 200 mg via INTRAVENOUS

## 2019-06-21 MED ORDER — ONDANSETRON HCL 4 MG/2ML IJ SOLN
INTRAMUSCULAR | Status: DC | PRN
  Administered 2019-06-21: 4 mg via INTRAVENOUS

## 2019-06-21 MED ORDER — LIDOCAINE HCL (CARDIAC) PF 100 MG/5ML IV SOSY
PREFILLED_SYRINGE | INTRAVENOUS | Status: DC | PRN
  Administered 2019-06-21: 100 mg via INTRAVENOUS

## 2019-06-21 MED ORDER — MIDAZOLAM HCL 2 MG/2ML IJ SOLN
INTRAMUSCULAR | Status: AC
  Filled 2019-06-21: qty 2

## 2019-06-21 MED ORDER — OXYCODONE HCL 5 MG/5ML PO SOLN: 5.0000 mg | Freq: Once | ORAL | Status: DC | PRN

## 2019-06-21 MED ORDER — FAMOTIDINE 20 MG PO TABS
ORAL_TABLET | ORAL | Status: AC
  Filled 2019-06-21: qty 1

## 2019-06-21 MED ORDER — FENTANYL CITRATE (PF) 100 MCG/2ML IJ SOLN: 25.0000 ug | INTRAMUSCULAR | Status: DC | PRN

## 2019-06-21 MED ORDER — LACTATED RINGERS IV SOLN: INTRAVENOUS | Status: DC | PRN

## 2019-06-21 SURGICAL SUPPLY — 19 items
CATH ROBINSON RED A/P 16FR (CATHETERS) ×2 IMPLANT
DEVICE MYOSURE LITE (MISCELLANEOUS) ×1 IMPLANT
DEVICE MYOSURE REACH (MISCELLANEOUS) IMPLANT
ELECT REM PT RETURN 9FT ADLT (ELECTROSURGICAL)
ELECTRODE REM PT RTRN 9FT ADLT (ELECTROSURGICAL) IMPLANT
GLOVE BIOGEL PI IND STRL 6.5 (GLOVE) ×2 IMPLANT
GLOVE BIOGEL PI INDICATOR 6.5 (GLOVE) ×2
GLOVE SURG SYN 6.5 ES PF (GLOVE) ×8 IMPLANT
GLOVE SURG SYN 6.5 PF PI (GLOVE) ×1 IMPLANT
GOWN STRL REUS W/ TWL LRG LVL3 (GOWN DISPOSABLE) ×2 IMPLANT
GOWN STRL REUS W/TWL LRG LVL3 (GOWN DISPOSABLE) ×2
KIT PROCEDURE FLUENT (KITS) ×1 IMPLANT
PACK DNC HYST (MISCELLANEOUS) ×2 IMPLANT
PAD OB MATERNITY 4.3X12.25 (PERSONAL CARE ITEMS) ×2 IMPLANT
PAD PREP 24X41 OB/GYN DISP (PERSONAL CARE ITEMS) ×2 IMPLANT
SEAL ROD LENS SCOPE MYOSURE (ABLATOR) ×2 IMPLANT
SOL .9 NS 3000ML IRR  AL (IV SOLUTION) ×1
SOL .9 NS 3000ML IRR UROMATIC (IV SOLUTION) ×1 IMPLANT
TOWEL OR 17X26 4PK STRL BLUE (TOWEL DISPOSABLE) ×2 IMPLANT

## 2019-06-21 NOTE — Progress Notes (Signed)
  Chaplain On-Call received page from Nurse Jenny Reichmann, who stated that the patient wants information about Living Will and wants to complete it today. Chaplain and Nurse agreed that because no Visitors who could serve as witnesses are permitted into the Pre-Op Unit, the best plan is to provide patient with the information so that she can act upon it later when at home. Chaplain delivered Advance Directives packet to [facility], who will give it to Nurse to give to patient.  [place]

## 2019-06-21 NOTE — Anesthesia Preprocedure Evaluation (Signed)
Anesthesia Evaluation  Patient identified by MRN, date of birth, ID band Patient awake    Reviewed: Allergy & Precautions, H&P , NPO status , Patient's Chart, lab work & pertinent test results  History of Anesthesia Complications Negative for: history of anesthetic complications  Airway Mallampati: I  TM Distance: >3 FB Neck ROM: full    Dental  (+) Chipped   Pulmonary neg pulmonary ROS, neg shortness of breath,           Cardiovascular Exercise Tolerance: Good (-) angina(-) Past MI and (-) DOE negative cardio ROS       Neuro/Psych negative neurological ROS  negative psych ROS   GI/Hepatic negative GI ROS, Neg liver ROS, neg GERD  ,  Endo/Other  negative endocrine ROS  Renal/GU      Musculoskeletal   Abdominal   Peds  Hematology negative hematology ROS (+)   Anesthesia Other Findings Past Medical History: No date: Anemia     Comment:  IRON DEFICIENCY. REQUIRED FERAHEME INFUSIONS. No date: Chest pain, unspecified  Past Surgical History: No date: CESAREAN SECTION     Comment:  x2 No date: DILATION AND CURETTAGE OF UTERUS  BMI    Body Mass Index: 31.04 kg/m      Reproductive/Obstetrics negative OB ROS                             Anesthesia Physical Anesthesia Plan  ASA: II  Anesthesia Plan: General LMA   Post-op Pain Management:    Induction: Intravenous  PONV Risk Score and Plan: Dexamethasone, Ondansetron, Midazolam and Treatment may vary due to age or medical condition  Airway Management Planned: LMA  Additional Equipment:   Intra-op Plan:   Post-operative Plan: Extubation in OR  Informed Consent: I have reviewed the patients History and Physical, chart, labs and discussed the procedure including the risks, benefits and alternatives for the proposed anesthesia with the patient or authorized representative who has indicated his/her understanding and acceptance.      Dental Advisory Given  Plan Discussed with: Anesthesiologist, CRNA and Surgeon  Anesthesia Plan Comments: (Patient consented for risks of anesthesia including but not limited to:  - adverse reactions to medications - damage to teeth, lips or other oral mucosa - sore throat or hoarseness - Damage to heart, brain, lungs or loss of life  Patient voiced understanding.)        Anesthesia Quick Evaluation

## 2019-06-21 NOTE — Interval H&P Note (Signed)
History and Physical Interval Note:  06/21/2019 9:09 AM  [name]  has presented today for surgery, with the diagnosis of Thickened endometrium R93.8 Abnormal uterine bleeding N93.9.  The various methods of treatment have been discussed with the patient and family. After consideration of risks, benefits and other options for treatment, the patient has consented to  Procedure(s): DILATATION AND CURETTAGE /HYSTEROSCOPY (N/A) as a surgical intervention.  The patient's history has been reviewed, patient examined, no change in status, stable for surgery.  I have reviewed the patient's chart and labs.  Questions were answered to the patient's satisfaction.     Krista Garza

## 2019-06-21 NOTE — Anesthesia Postprocedure Evaluation (Signed)
Anesthesia Post Note  Patient: Krista Garza  Procedure(s) Performed: DILATATION AND CURETTAGE /HYSTEROSCOPY, Polypectomy with myosure (N/A )  Patient location during evaluation: PACU Anesthesia Type: General Level of consciousness: awake and alert Pain management: pain level controlled Vital Signs Assessment: post-procedure vital signs reviewed and stable Respiratory status: spontaneous breathing, nonlabored ventilation and respiratory function stable Cardiovascular status: blood pressure returned to baseline and stable Postop Assessment: no apparent nausea or vomiting Anesthetic complications: no     Last Vitals:  Vitals:   06/21/19 1113 06/21/19 1121  BP: 125/77 122/79  Pulse: 72 71  Resp: (!) 22 16  Temp:  36.4 C  SpO2: 100% 100%    Last Pain:  Vitals:   06/21/19 1121  TempSrc: Temporal  PainSc: 0-No pain                 Tera Mater

## 2019-06-21 NOTE — Discharge Instructions (Signed)
Hysteroscopy, Care After This sheet gives you information about how to care for yourself after your procedure. Your health care provider may also give you more specific instructions. If you have problems or questions, contact your health care provider. What can I expect after the procedure? After the procedure, it is common to have:  Cramping.  Bleeding. This can vary from light spotting to menstrual-like bleeding. Follow these instructions at home: Activity  Rest for 1-2 days after the procedure.  Do not douche, use tampons, or have sex for 2 weeks after the procedure, or until your health care provider approves.  Do not drive for 24 hours after the procedure, or for as long as told by your health care provider.  Do not drive, use heavy machinery, or drink alcohol while taking prescription pain medicines. Medicines   Take over-the-counter and prescription medicines only as told by your health care provider.  Do not take aspirin during recovery. It can increase the risk of bleeding. General instructions  Do not take baths, swim, or use a hot tub until your health care provider approves. Take showers instead of baths for 2 weeks, or for as long as told by your health care provider.  To prevent or treat constipation while you are taking prescription pain medicine, your health care provider may recommend that you: ? Drink enough fluid to keep your urine clear or pale yellow. ? Take over-the-counter or prescription medicines. ? Eat foods that are high in fiber, such as fresh fruits and vegetables, whole grains, and beans. ? Limit foods that are high in fat and processed sugars, such as fried and sweet foods.  Keep all follow-up visits as told by your health care provider. This is important. Contact a health care provider if:  You feel dizzy or lightheaded.  You feel nauseous.  You have abnormal vaginal discharge.  You have a rash.  You have pain that does not get better with  medicine.  You have chills. Get help right away if:  You have bleeding that is heavier than a normal menstrual period.  You have a fever.  You have pain or cramps that get worse.  You develop new abdominal pain.  You faint.  You have pain in your shoulders.  You have shortness of breath. Summary  After the procedure, you may have cramping and some vaginal bleeding.  Do not douche, use tampons, or have sex for 2 weeks after the procedure, or until your health care provider approves.  Do not take baths, swim, or use a hot tub until your health care provider approves. Take showers instead of baths for 2 weeks, or for as long as told by your health care provider.  Report any unusual symptoms to your health care provider.  Keep all follow-up visits as told by your health care provider. This is important. This information is not intended to replace advice given to you by your health care provider. Make sure you discuss any questions you have with your health care provider. Document Revised: 02/20/2017 Document Reviewed: 04/08/2016 Elsevier Patient Education  2020 [employer]   AMBULATORY SURGERY  DISCHARGE INSTRUCTIONS   1) The drugs that you were given will stay in your system until tomorrow so for the next 24 hours you should not:  A) Drive an automobile B) Make any legal decisions C) Drink any alcoholic beverage   2) You may resume regular meals tomorrow.  Today it is better to start with liquids and gradually work up to   solid foods.  You may eat anything you prefer, but it is better to start with liquids, then soup and crackers, and gradually work up to solid foods.   3) Please notify your doctor immediately if you have any unusual bleeding, trouble breathing, redness and pain at the surgery site, drainage, fever, or pain not relieved by medication.    4) Additional Instructions:        Please contact your physician with any problems or Same Day Surgery  at [phone], Monday through Friday 6 am to 4 pm, or [place] [facility] [place] number at [phone]. 

## 2019-06-21 NOTE — Anesthesia Procedure Notes (Signed)
Procedure Name: LMA Insertion Performed by: Abdiel Blackerby, CRNA Pre-anesthesia Checklist: Patient identified, Patient being monitored, Timeout performed, Emergency Drugs available and Suction available Patient Re-evaluated:Patient Re-evaluated prior to induction Oxygen Delivery Method: Circle system utilized Preoxygenation: Pre-oxygenation with 100% oxygen Induction Type: IV induction Ventilation: Mask ventilation without difficulty LMA: LMA inserted LMA Size: 3.5 Tube type: Oral Number of attempts: 1 Placement Confirmation: positive ETCO2 and breath sounds checked- equal and bilateral Tube secured with: Tape Dental Injury: Teeth and Oropharynx as per pre-operative assessment        

## 2019-06-21 NOTE — Op Note (Signed)
Operative Note  06/21/2019  PRE-OP DIAGNOSIS: Abnormal uterine bleeding, thickened endometrium  POST-OP DIAGNOSIS: same   SURGEON: Armina Galloway MD  PROCEDURE: Procedure(s): DILATATION AND CURETTAGE /HYSTEROSCOPY, Polypectomy with myosure   ANESTHESIA: Choice   ESTIMATED BLOOD LOSS: 5 cc   SPECIMENS:  Endometrial curettings, endometrial and endocervical polyp  FLUID DEFICIT: 123456 cc  COMPLICATIONS: None  DISPOSITION: PACU - hemodynamically stable.  CONDITION: stable  FINDINGS: Exam under anesthesia revealed  Anteverted 12 cm uterus with bilateral adnexa no masses or fullness. Hysteroscopy revealed an endometrial polyp in the  uterine cavity with bilateral tubal ostia and a endocervical polyp in the cervical canal..  PROCEDURE IN DETAIL: After informed consent was obtained, the patient was taken to the operating room where anesthesia was obtained without difficulty. The patient was positioned in the dorsal lithotomy position in [place]. The patient's bladder was catheterized with an in and out foley catheter. The patient was examined under anesthesia, with the above noted findings. The weightedspeculum was placed inside the patient's vagina, and the the anterior lip of the cervix was seen and grasped with the tenaculum.  The uterine cavity was sounded to 12cm, and then the cervix was progressively dilated to a 16 French-Pratt dilator. The 0 degree hysteroscope was introduced, with saline fluid used to distend the intrauterine cavity, with the above noted findings.  The Myosure was used to remove the uterine and cervical polyp. Once the polyps were removed in entirety the hysteroscope was removed.   The uterine cavity was curetted until a gritty texture was noted, yielding endometrial curettings. Excellent hemostasis was noted, and all instruments were removed, with excellent hemostasis noted throughout. She was then taken out of dorsal lithotomy. Minimal discrepancy in fluid  was noted.  The patient tolerated the procedure well. Sponge, lap and needle counts were correct x2. The patient was taken to recovery room in excellent condition.  Adrian Prows MD [facility], [facility] 06/21/2019 10:31 AM

## 2019-06-21 NOTE — Transfer of Care (Signed)
Immediate Anesthesia Transfer of Care Note  Patient: Krista Garza  Procedure(s) Performed: DILATATION AND CURETTAGE /HYSTEROSCOPY, Polypectomy with myosure (N/A )  Patient Location: PACU  Anesthesia Type:General  Level of Consciousness: sedated  Airway & Oxygen Therapy: Patient Spontanous Breathing and Patient connected to face mask oxygen  Post-op Assessment: Report given to RN and Post -op Vital signs reviewed and stable  Post vital signs: Reviewed and stable  Last Vitals:  Vitals Value Taken Time  BP 110/68 06/21/19 1023  Temp    Pulse 65 06/21/19 1025  Resp 17 06/21/19 1025  SpO2 100 % 06/21/19 1025  Vitals shown include unvalidated device data.  Last Pain:  Vitals:   06/21/19 0856  TempSrc: Tympanic  PainSc: 0-No pain         Complications: No apparent anesthesia complications

## 2019-06-22 LAB — SURGICAL PATHOLOGY

## 2019-06-27 ENCOUNTER — Ambulatory Visit: Payer: No Typology Code available for payment source | Admitting: Obstetrics and Gynecology

## 2019-06-29 ENCOUNTER — Encounter: Payer: Self-pay | Admitting: Obstetrics and Gynecology

## 2019-06-29 ENCOUNTER — Ambulatory Visit (INDEPENDENT_AMBULATORY_CARE_PROVIDER_SITE_OTHER): Payer: No Typology Code available for payment source | Admitting: Obstetrics and Gynecology

## 2019-06-29 ENCOUNTER — Telehealth: Payer: Self-pay | Admitting: Gastroenterology

## 2019-06-29 ENCOUNTER — Other Ambulatory Visit: Payer: Self-pay

## 2019-06-29 ENCOUNTER — Telehealth: Payer: Self-pay

## 2019-06-29 VITALS — BP 110/64 | Ht 74.5 in | Wt 245.0 lb

## 2019-06-29 DIAGNOSIS — Z4889 Encounter for other specified surgical aftercare: Secondary | ICD-10-CM

## 2019-06-29 DIAGNOSIS — N760 Acute vaginitis: Secondary | ICD-10-CM

## 2019-06-29 DIAGNOSIS — N840 Polyp of corpus uteri: Secondary | ICD-10-CM

## 2019-06-29 MED ORDER — METRONIDAZOLE 500 MG PO TABS: 500.0000 mg | ORAL_TABLET | Freq: Two times a day (BID) | ORAL | 0 refills | Status: AC

## 2019-06-29 NOTE — Progress Notes (Signed)
  Postoperative Follow-up Patient presents post op from operative hysteroscopy for endometrial polyp, 1 week ago.  Subjective: Patient reports marked improvement in her preop symptoms. Eating a regular diet without difficulty. Pain is controlled without any medications.  Activity: normal activities of daily living. Patient reports additional symptom's since surgery of abnormal vaginal odor.  Objective: BP 110/64   Ht 6' 2.5" (1.892 m)   Wt 245 lb (111.1 kg)   BMI 31.04 kg/m  Physical Exam Constitutional:      Appearance: She is well-developed.  Genitourinary:     Vagina and uterus normal.     No lesions in the vagina.     No cervical motion tenderness.     No right or left adnexal mass present.  HENT:     Head: Normocephalic and atraumatic.  Neck:     Thyroid: No thyromegaly.  Cardiovascular:     Rate and Rhythm: Normal rate and regular rhythm.     Heart sounds: Normal heart sounds.  Pulmonary:     Effort: Pulmonary effort is normal.     Breath sounds: Normal breath sounds.  Chest:     Breasts:        Right: No inverted nipple, mass, nipple discharge or skin change.        Left: No inverted nipple, mass, nipple discharge or skin change.  Abdominal:     General: Bowel sounds are normal. There is no distension.     Palpations: Abdomen is soft. There is no mass.  Musculoskeletal:     Cervical back: Neck supple.  Neurological:     Mental Status: She is alert and oriented to person, place, and time.  Skin:    General: Skin is warm and dry.  Psychiatric:        Behavior: Behavior normal.        Thought Content: Thought content normal.        Judgment: Judgment normal.  Vitals reviewed.   Assessment: s/p :  operative hysteroscopy stable  Plan: Patient has done well after surgery with no apparent complications.  I have discussed the post-operative course to date, and the expected progress moving forward.  The patient understands what complications to be concerned about.   I will see the patient in routine follow up, or sooner if needed.    Will follow up if she continues to have issues with heavy periods. Had a bulky uterus on abdominal exam and could have adenomyosis. Will consider pelvic MRI if menorrhagia continues. Will treat vaginal odor presumptive bacterial vaginitis with flagyl.   Activity plan: No restriction. Pelvic rest.  Jewel Mcafee R Jamarius Saha 06/29/2019, 1:36 PM

## 2019-06-29 NOTE — Telephone Encounter (Signed)
LVM informing patient that her insurance will not cover her colonoscopy until the age of 79.  Asked her to please call the office back to let us know if she would like to reschedule or cancel procedure.  Thanks,  [place], [place]

## 2019-06-29 NOTE — Telephone Encounter (Signed)
Patient wants to cancel her colonoscopy & r/s it to another location ([place]). She is aware she she will be self pay.

## 2019-07-01 ENCOUNTER — Other Ambulatory Visit: Admission: RE | Admit: 2019-07-01 | Payer: No Typology Code available for payment source | Source: Ambulatory Visit

## 2019-07-04 ENCOUNTER — Encounter: Payer: Self-pay | Admitting: Family Medicine

## 2019-07-04 ENCOUNTER — Telehealth: Payer: Self-pay

## 2019-07-04 NOTE — Telephone Encounter (Signed)
Returned patients call.  LVM advising that we have Monday's available at [facility] if she would like to still schedule we have availability in May and June.  Thank you,  Sharyn Lull, CMA

## 2019-07-05 ENCOUNTER — Ambulatory Visit
Admission: RE | Admit: 2019-07-05 | Payer: No Typology Code available for payment source | Source: Ambulatory Visit | Admitting: Gastroenterology

## 2019-07-05 ENCOUNTER — Encounter: Admission: RE | Payer: Self-pay | Source: Ambulatory Visit

## 2019-07-05 SURGERY — COLONOSCOPY WITH PROPOFOL
Anesthesia: General

## 2019-07-20 ENCOUNTER — Other Ambulatory Visit: Payer: Self-pay | Admitting: Obstetrics and Gynecology

## 2019-07-20 DIAGNOSIS — N939 Abnormal uterine and vaginal bleeding, unspecified: Secondary | ICD-10-CM

## 2019-07-20 MED ORDER — MEDROXYPROGESTERONE ACETATE 10 MG PO TABS: 20.0000 mg | ORAL_TABLET | Freq: Two times a day (BID) | ORAL | 0 refills | Status: DC

## 2019-07-20 NOTE — Progress Notes (Signed)
After procedure bleeding was really light. The week afterwards she had a cycle starting on 07/01/2019. Then after 7 days she started having very heavy bleeding again. She has been taking 20 mg of medroxyprogesterone once a day. She does not remember when she was supposed to get Depo Provera.

## 2019-09-02 ENCOUNTER — Telehealth: Payer: Self-pay | Admitting: Oncology

## 2019-09-02 NOTE — Telephone Encounter (Signed)
Attempted to reach the patient again wth no answer. Detailed message left and Mychart message sent this morning to contact office for rescheduling.

## 2019-09-03 ENCOUNTER — Encounter: Payer: Self-pay | Admitting: Family Medicine

## 2019-09-04 NOTE — Progress Notes (Deleted)
Krista Garza  [phone] Fax:([phone]) [phone]  ID: Krista Garza OB: 09-06-1975  MR#: [id number]  WIO#:[id number]  Patient Care Team: Delsa Grana, PA-C as PCP - General (Family Medicine)  CHIEF COMPLAINT: Iron deficiency anemia.  INTERVAL HISTORY: Patient returns to clinic today for repeat laboratory work, further evaluation and continuation of IV Feraheme.  She feels mildly improved since receiving her infusions several months ago, but still complains of increased weakness and fatigue.  Despite this, she remains active and continues to work full-time.  She has no neurologic complaints.  She denies any recent fevers or illnesses.  She has a good appetite and denies weight loss.  She has no chest pain, shortness of breath, cough, or hemoptysis.  She denies any nausea, vomiting, constipation, or diarrhea.  She has no melena or hematochezia.  She has no urinary complaints.  Patient offers no further specific complaints today.  REVIEW OF SYSTEMS:   Review of Systems  Constitutional: Positive for malaise/fatigue. Negative for fever and weight loss.  Respiratory: Negative.  Negative for cough, hemoptysis and shortness of breath.   Cardiovascular: Negative.  Negative for chest pain and leg swelling.  Gastrointestinal: Negative.  Negative for abdominal pain, blood in stool and melena.  Genitourinary: Negative.  Negative for hematuria.  Musculoskeletal: Negative.  Negative for back pain.  Skin: Negative.  Negative for rash.  Neurological: Positive for weakness. Negative for dizziness, focal weakness and headaches.  Psychiatric/Behavioral: Negative.  The patient is not nervous/anxious.     As per HPI. Otherwise, a complete review of systems is negative.  PAST MEDICAL HISTORY: Past Medical History:  Diagnosis Date  . Anemia    IRON DEFICIENCY. REQUIRED FERAHEME INFUSIONS.  Marland Kitchen Chest pain, unspecified     PAST SURGICAL HISTORY: Past Surgical History:  Procedure  Laterality Date  . CESAREAN SECTION     x2  . DILATION AND CURETTAGE OF UTERUS    . HYSTEROSCOPY WITH D & C N/A 06/21/2019   Procedure: DILATATION AND CURETTAGE /HYSTEROSCOPY, Polypectomy with myosure;  Surgeon: Homero Fellers, MD;  Location: [facility] ORS;  Service: Gynecology;  Laterality: N/A;    FAMILY HISTORY: Family History  Problem Relation Age of Onset  . Diabetes Father   . Hyperlipidemia Father   . Hypertension Father   . Cancer Father 94       colon  . Colon cancer Father     ADVANCED DIRECTIVES (Y/N):  N  HEALTH MAINTENANCE: Social History   Tobacco Use  . Smoking status: Never Smoker  . Smokeless tobacco: Never Used  Vaping Use  . Vaping Use: Never used  Substance Use Topics  . Alcohol use: Yes    Comment: margaritas occasionally  . Drug use: No     Colonoscopy:  PAP:  Bone density:  Lipid panel:  Allergies  Allergen Reactions  . Adhesive [Tape] Rash    Paper tape okay.  bandaids are a problem.    Current Outpatient Medications  Medication Sig Dispense Refill  . acetaminophen (TYLENOL) 500 MG tablet Take 2 tablets (1,000 mg total) by mouth every 6 (six) hours as needed. (Patient not taking: Reported on 06/29/2019) 100 tablet 0  . ibuprofen (ADVIL) 800 MG tablet Take 1 tablet (800 mg total) by mouth every 8 (eight) hours as needed for cramping. (Patient not taking: Reported on 06/29/2019) 30 tablet 0  . medroxyPROGESTERone (PROVERA) 10 MG tablet Take 2 tablets (20 mg total) by mouth in the morning and at bedtime. Take 20 mg  twice a day for ten days. Then take 10 mg once a day. 80 tablet 0  . Multiple Vitamins-Minerals (WOMENS ONE DAILY PO) Take 1 tablet by mouth daily.      No current facility-administered medications for this visit.    OBJECTIVE: There were no vitals filed for this visit.   There is no height or weight on file to calculate BMI.    ECOG FS:0 - Asymptomatic  General: Well-developed, well-nourished, no acute distress. Eyes: Pink  conjunctiva, anicteric sclera. HEENT: Normocephalic, moist mucous membranes. Lungs: No audible wheezing or coughing. Heart: Regular rate and rhythm. Abdomen: Soft, nontender, no obvious distention. Musculoskeletal: No edema, cyanosis, or clubbing. Neuro: Alert, answering all questions appropriately. Cranial nerves grossly intact. Skin: No rashes or petechiae noted. Psych: Normal affect.   LAB RESULTS:  Lab Results  Component Value Date   NA 137 03/02/2019   K 4.2 03/02/2019   CL 104 03/02/2019   CO2 26 03/02/2019   GLUCOSE 83 03/02/2019   BUN 11 03/02/2019   CREATININE 0.70 03/02/2019   CALCIUM 9.4 03/02/2019   PROT 7.3 03/02/2019   AST 15 03/02/2019   ALT 8 03/02/2019   BILITOT 0.3 03/02/2019   GFRNONAA 105 03/02/2019   GFRAA 121 03/02/2019    Lab Results  Component Value Date   WBC 6.8 06/17/2019   NEUTROABS 4.0 06/02/2019   HGB 11.8 (L) 06/17/2019   HCT 38.6 06/17/2019   MCV 85.0 06/17/2019   PLT 331 06/17/2019   Lab Results  Component Value Date   IRON 16 (L) 06/02/2019   TIBC 360 06/02/2019   IRONPCTSAT 4 (L) 06/02/2019   Lab Results  Component Value Date   FERRITIN 6 (L) 06/02/2019     STUDIES: No results found.  ASSESSMENT: Iron deficiency anemia secondary to chronic blood loss.  PLAN:    1.  Iron deficiency anemia: Likely secondary to chronic heavy menses.  Patient's hemoglobin and iron stores have improved, but still decreased.  She also remains symptomatic.  Continue oral iron supplementation.  Proceed with 510 mg IV Feraheme today.  Return to clinic in 1 week for second infusion.  Patient will then return to clinic in 3 months with repeat laboratory work, further evaluation, and continuation of treatment if needed.   2.  Heavy menses: Continue follow-up and treatment per gynecology. 3.  Thrombocytosis: Resolved.  I spent a total of 30 minutes reviewing chart data, face-to-face evaluation with the patient, counseling and coordination of care as  detailed above.   Patient expressed understanding and was in agreement with this plan. She also understands that She can call clinic at any time with any questions, concerns, or complaints.    Lloyd Huger, MD   09/04/2019 8:06 AM

## 2019-09-05 ENCOUNTER — Other Ambulatory Visit: Payer: Self-pay

## 2019-09-05 ENCOUNTER — Inpatient Hospital Stay: Payer: No Typology Code available for payment source | Attending: Oncology

## 2019-09-05 DIAGNOSIS — R7989 Other specified abnormal findings of blood chemistry: Secondary | ICD-10-CM | POA: Insufficient documentation

## 2019-09-05 DIAGNOSIS — N920 Excessive and frequent menstruation with regular cycle: Secondary | ICD-10-CM | POA: Insufficient documentation

## 2019-09-05 DIAGNOSIS — Z8249 Family history of ischemic heart disease and other diseases of the circulatory system: Secondary | ICD-10-CM | POA: Diagnosis not present

## 2019-09-05 DIAGNOSIS — Z800 Family history of malignant neoplasm of digestive organs: Secondary | ICD-10-CM | POA: Insufficient documentation

## 2019-09-05 DIAGNOSIS — Z833 Family history of diabetes mellitus: Secondary | ICD-10-CM | POA: Diagnosis not present

## 2019-09-05 DIAGNOSIS — D500 Iron deficiency anemia secondary to blood loss (chronic): Secondary | ICD-10-CM | POA: Insufficient documentation

## 2019-09-05 LAB — CBC WITH DIFFERENTIAL/PLATELET
Abs Immature Granulocytes: 0.02 10*3/uL (ref 0.00–0.07)
Basophils Absolute: 0.1 10*3/uL (ref 0.0–0.1)
Basophils Relative: 1 %
Eosinophils Absolute: 0.2 10*3/uL (ref 0.0–0.5)
Eosinophils Relative: 3 %
HCT: 31.9 % — ABNORMAL LOW (ref 36.0–46.0)
Hemoglobin: 9.5 g/dL — ABNORMAL LOW (ref 12.0–15.0)
Immature Granulocytes: 0 %
Lymphocytes Relative: 29 %
Lymphs Abs: 2 10*3/uL (ref 0.7–4.0)
MCH: 21.6 pg — ABNORMAL LOW (ref 26.0–34.0)
MCHC: 29.8 g/dL — ABNORMAL LOW (ref 30.0–36.0)
MCV: 72.5 fL — ABNORMAL LOW (ref 80.0–100.0)
Monocytes Absolute: 0.8 10*3/uL (ref 0.1–1.0)
Monocytes Relative: 13 %
Neutro Abs: 3.6 10*3/uL (ref 1.7–7.7)
Neutrophils Relative %: 54 %
Platelets: 441 10*3/uL — ABNORMAL HIGH (ref 150–400)
RBC: 4.4 MIL/uL (ref 3.87–5.11)
RDW: 16.8 % — ABNORMAL HIGH (ref 11.5–15.5)
WBC: 6.7 10*3/uL (ref 4.0–10.5)
nRBC: 0 % (ref 0.0–0.2)

## 2019-09-05 LAB — IRON AND TIBC
Iron: 11 ug/dL — ABNORMAL LOW (ref 28–170)
Saturation Ratios: 3 % — ABNORMAL LOW (ref 10.4–31.8)
TIBC: 420 ug/dL (ref 250–450)
UIBC: 409 ug/dL

## 2019-09-05 LAB — FERRITIN: Ferritin: 3 ng/mL — ABNORMAL LOW (ref 11–307)

## 2019-09-06 ENCOUNTER — Inpatient Hospital Stay: Payer: No Typology Code available for payment source

## 2019-09-06 ENCOUNTER — Inpatient Hospital Stay: Payer: No Typology Code available for payment source | Admitting: Oncology

## 2019-09-08 ENCOUNTER — Other Ambulatory Visit: Payer: Self-pay

## 2019-09-08 DIAGNOSIS — N939 Abnormal uterine and vaginal bleeding, unspecified: Secondary | ICD-10-CM

## 2019-09-08 NOTE — Telephone Encounter (Signed)
Advise if refill is appropriate 

## 2019-09-08 NOTE — Progress Notes (Signed)
[place]  Telephone:(336) [phone] Fax:(336) [phone]  ID: Krista Garza OB: [birth year]  MR#: [id number]  JME#:[id number]  Patient Care Team: Delsa Grana, PA-C as PCP - General (Family Medicine) Lloyd Huger, MD as Consulting Physician (Oncology)  CHIEF COMPLAINT: Iron deficiency anemia.  INTERVAL HISTORY: Patient returns to clinic today for repeat laboratory work, further evaluation, and continuation of IV Feraheme.  She continues to feel weakness and fatigue, but otherwise feels well.  She continues to have heavy menses and this is actively being evaluated by gynecology.  She has no neurologic complaints.  She denies any recent fevers or illnesses.  She has a good appetite and denies weight loss.  She has no chest pain, shortness of breath, cough, or hemoptysis.  She denies any nausea, vomiting, constipation, or diarrhea.  She has no melena or hematochezia.  She has no urinary complaints.  Patient offers no further specific complaints today.  REVIEW OF SYSTEMS:   Review of Systems  Constitutional: Positive for malaise/fatigue. Negative for fever and weight loss.  Respiratory: Negative.  Negative for cough, hemoptysis and shortness of breath.   Cardiovascular: Negative.  Negative for chest pain and leg swelling.  Gastrointestinal: Negative.  Negative for abdominal pain, blood in stool and melena.  Genitourinary: Negative.  Negative for hematuria.  Musculoskeletal: Negative.  Negative for back pain.  Skin: Negative.  Negative for rash.  Neurological: Positive for weakness. Negative for dizziness, focal weakness and headaches.  Psychiatric/Behavioral: Negative.  The patient is not nervous/anxious.     As per HPI. Otherwise, a complete review of systems is negative.  PAST MEDICAL HISTORY: Past Medical History:  Diagnosis Date  . Anemia    IRON DEFICIENCY. REQUIRED FERAHEME INFUSIONS.  Marland Kitchen Chest pain, unspecified     PAST SURGICAL HISTORY: Past Surgical  History:  Procedure Laterality Date  . CESAREAN SECTION     x2  . DILATION AND CURETTAGE OF UTERUS    . HYSTEROSCOPY WITH D & C N/A 06/21/2019   Procedure: DILATATION AND CURETTAGE /HYSTEROSCOPY, Polypectomy with myosure;  Surgeon: Homero Fellers, MD;  Location: [facility] ORS;  Service: Gynecology;  Laterality: N/A;    FAMILY HISTORY: Family History  Problem Relation Age of Onset  . Diabetes Father   . Hyperlipidemia Father   . Hypertension Father   . Cancer Father 47       colon  . Colon cancer Father     ADVANCED DIRECTIVES (Y/N):  N  HEALTH MAINTENANCE: Social History   Tobacco Use  . Smoking status: Never Smoker  . Smokeless tobacco: Never Used  Vaping Use  . Vaping Use: Never used  Substance Use Topics  . Alcohol use: Yes    Comment: margaritas occasionally  . Drug use: No     Colonoscopy:  PAP:  Bone density:  Lipid panel:  Allergies  Allergen Reactions  . Adhesive [Tape] Rash    Paper tape okay.  bandaids are a problem.    Current Outpatient Medications  Medication Sig Dispense Refill  . medroxyPROGESTERone (PROVERA) 10 MG tablet Take 2 tablets (20 mg total) by mouth in the morning and at bedtime. Take 20 mg twice a day for ten days. Then take 10 mg once a day. 80 tablet 0  . Multiple Vitamins-Minerals (WOMENS ONE DAILY PO) Take 1 tablet by mouth daily.     . vitamin B-12 (CYANOCOBALAMIN) 500 MCG tablet Take 500 mcg by mouth daily.    Marland Kitchen acetaminophen (TYLENOL) 500 MG tablet Take 2  tablets (1,000 mg total) by mouth every 6 (six) hours as needed. (Patient not taking: Reported on 06/29/2019) 100 tablet 0  . ibuprofen (ADVIL) 800 MG tablet Take 1 tablet (800 mg total) by mouth every 8 (eight) hours as needed for cramping. (Patient not taking: Reported on 06/29/2019) 30 tablet 0   No current facility-administered medications for this visit.    OBJECTIVE: Vitals:   09/12/19 1327  BP: 134/78  Pulse: 70  Resp: 20  Temp: (!) 97.5 F (36.4 C)  SpO2: 100%      Body mass index is 31.71 kg/m.    ECOG FS:0 - Asymptomatic  General: Well-developed, well-nourished, no acute distress. Eyes: Pink conjunctiva, anicteric sclera. HEENT: Normocephalic, moist mucous membranes. Lungs: No audible wheezing or coughing. Heart: Regular rate and rhythm. Abdomen: Soft, nontender, no obvious distention. Musculoskeletal: No edema, cyanosis, or clubbing. Neuro: Alert, answering all questions appropriately. Cranial nerves grossly intact. Skin: No rashes or petechiae noted. Psych: Normal affect.  LAB RESULTS:  Lab Results  Component Value Date   NA 137 03/02/2019   K 4.2 03/02/2019   CL 104 03/02/2019   CO2 26 03/02/2019   GLUCOSE 83 03/02/2019   BUN 11 03/02/2019   CREATININE 0.70 03/02/2019   CALCIUM 9.4 03/02/2019   PROT 7.3 03/02/2019   AST 15 03/02/2019   ALT 8 03/02/2019   BILITOT 0.3 03/02/2019   GFRNONAA 105 03/02/2019   GFRAA 121 03/02/2019    Lab Results  Component Value Date   WBC 6.7 09/05/2019   NEUTROABS 3.6 09/05/2019   HGB 9.5 (L) 09/05/2019   HCT 31.9 (L) 09/05/2019   MCV 72.5 (L) 09/05/2019   PLT 441 (H) 09/05/2019   Lab Results  Component Value Date   IRON 11 (L) 09/05/2019   TIBC 420 09/05/2019   IRONPCTSAT 3 (L) 09/05/2019   Lab Results  Component Value Date   FERRITIN 3 (L) 09/05/2019     STUDIES: No results found.  ASSESSMENT: Iron deficiency anemia secondary to chronic blood loss.  PLAN:    1.  Iron deficiency anemia: Likely secondary to chronic heavy menses.  Patient's hemoglobin and iron stores have trended down and she is more symptomatic.  Continue oral iron supplementation.  Patient will also proceed with 510 mg IV Feraheme today.  Return to clinic in 1 week for second infusion.  Patient will then return to clinic in 3 months with repeat laboratory work further evaluation, and continuation of treatment if needed.    2.  Heavy menses: Continue follow-up and treatment per gynecology. 3.  Thrombocytosis:  Secondary to iron deficiency.  IV Feraheme as above.  I spent a total of 30 minutes reviewing chart data, face-to-face evaluation with the patient, counseling and coordination of care as detailed above.   Patient expressed understanding and was in agreement with this plan. She also understands that She can call clinic at any time with any questions, concerns, or complaints.    Lloyd Huger, MD   09/12/2019 2:51 PM

## 2019-09-09 MED ORDER — MEDROXYPROGESTERONE ACETATE 10 MG PO TABS: 20.0000 mg | ORAL_TABLET | Freq: Two times a day (BID) | ORAL | 0 refills | Status: DC

## 2019-09-12 ENCOUNTER — Inpatient Hospital Stay: Payer: No Typology Code available for payment source

## 2019-09-12 ENCOUNTER — Other Ambulatory Visit: Payer: Self-pay

## 2019-09-12 ENCOUNTER — Inpatient Hospital Stay (HOSPITAL_BASED_OUTPATIENT_CLINIC_OR_DEPARTMENT_OTHER): Payer: No Typology Code available for payment source | Admitting: Oncology

## 2019-09-12 VITALS — BP 134/78 | HR 70 | Temp 97.5°F | Resp 20 | Wt 250.3 lb

## 2019-09-12 VITALS — BP 113/84 | HR 60

## 2019-09-12 DIAGNOSIS — D500 Iron deficiency anemia secondary to blood loss (chronic): Secondary | ICD-10-CM

## 2019-09-12 MED ORDER — SODIUM CHLORIDE 0.9 % IV SOLN
Freq: Once | INTRAVENOUS | Status: AC
  Filled 2019-09-12: qty 250

## 2019-09-12 MED ORDER — SODIUM CHLORIDE 0.9 % IV SOLN
510.0000 mg | Freq: Once | INTRAVENOUS | Status: AC
  Administered 2019-09-12: 510 mg via INTRAVENOUS
  Filled 2019-09-12: qty 510

## 2019-09-19 ENCOUNTER — Inpatient Hospital Stay: Payer: No Typology Code available for payment source

## 2019-10-04 ENCOUNTER — Inpatient Hospital Stay: Payer: No Typology Code available for payment source

## 2019-10-10 ENCOUNTER — Inpatient Hospital Stay: Payer: No Typology Code available for payment source | Attending: Oncology

## 2019-10-10 ENCOUNTER — Other Ambulatory Visit: Payer: Self-pay

## 2019-10-10 VITALS — BP 124/87 | HR 60 | Temp 96.0°F | Resp 18

## 2019-10-10 DIAGNOSIS — R5383 Other fatigue: Secondary | ICD-10-CM | POA: Diagnosis not present

## 2019-10-10 DIAGNOSIS — Z79899 Other long term (current) drug therapy: Secondary | ICD-10-CM | POA: Diagnosis not present

## 2019-10-10 DIAGNOSIS — Z833 Family history of diabetes mellitus: Secondary | ICD-10-CM | POA: Diagnosis not present

## 2019-10-10 DIAGNOSIS — R531 Weakness: Secondary | ICD-10-CM | POA: Diagnosis not present

## 2019-10-10 DIAGNOSIS — Z8249 Family history of ischemic heart disease and other diseases of the circulatory system: Secondary | ICD-10-CM | POA: Diagnosis not present

## 2019-10-10 DIAGNOSIS — N920 Excessive and frequent menstruation with regular cycle: Secondary | ICD-10-CM | POA: Diagnosis not present

## 2019-10-10 DIAGNOSIS — Z800 Family history of malignant neoplasm of digestive organs: Secondary | ICD-10-CM | POA: Insufficient documentation

## 2019-10-10 DIAGNOSIS — D473 Essential (hemorrhagic) thrombocythemia: Secondary | ICD-10-CM | POA: Insufficient documentation

## 2019-10-10 DIAGNOSIS — D500 Iron deficiency anemia secondary to blood loss (chronic): Secondary | ICD-10-CM | POA: Diagnosis not present

## 2019-10-10 DIAGNOSIS — Z83438 Family history of other disorder of lipoprotein metabolism and other lipidemia: Secondary | ICD-10-CM | POA: Diagnosis not present

## 2019-10-10 MED ORDER — SODIUM CHLORIDE 0.9 % IV SOLN
Freq: Once | INTRAVENOUS | Status: AC
  Filled 2019-10-10: qty 250

## 2019-10-10 MED ORDER — SODIUM CHLORIDE 0.9 % IV SOLN
510.0000 mg | Freq: Once | INTRAVENOUS | Status: AC
  Administered 2019-10-10: 510 mg via INTRAVENOUS
  Filled 2019-10-10: qty 510

## 2019-12-05 ENCOUNTER — Other Ambulatory Visit: Payer: Self-pay

## 2019-12-05 DIAGNOSIS — N939 Abnormal uterine and vaginal bleeding, unspecified: Secondary | ICD-10-CM

## 2019-12-05 MED ORDER — MEDROXYPROGESTERONE ACETATE 10 MG PO TABS: 20.0000 mg | ORAL_TABLET | Freq: Two times a day (BID) | ORAL | 0 refills | Status: DC

## 2019-12-11 NOTE — Progress Notes (Signed)
[facility]  [phone] [phone]  ID: Krista Garza OB: 12-29-1975  MR#: [id number]  ACZ#:[id number]  Patient Care Team: Delsa Grana, PA-C as PCP - General (Family Medicine) Lloyd Huger, MD as Consulting Physician (Oncology)  CHIEF COMPLAINT: Iron deficiency anemia.  INTERVAL HISTORY: Patient returns to clinic today for repeat laboratory work, further evaluation, and continuation of Feraheme.  Her weakness and fatigue have improved.  She otherwise feels well. She continues to have heavy menses and this is actively being evaluated by gynecology.  She has no neurologic complaints.  She denies any recent fevers or illnesses.  She has a good appetite and denies weight loss.  She has no chest pain, shortness of breath, cough, or hemoptysis.  She denies any nausea, vomiting, constipation, or diarrhea.  She has no melena or hematochezia.  She has no urinary complaints.  Patient offers no further specific complaints today.  REVIEW OF SYSTEMS:   Review of Systems  Constitutional: Positive for malaise/fatigue. Negative for fever and weight loss.  Respiratory: Negative.  Negative for cough, hemoptysis and shortness of breath.   Cardiovascular: Negative.  Negative for chest pain and leg swelling.  Gastrointestinal: Negative.  Negative for abdominal pain, blood in stool and melena.  Genitourinary: Negative.  Negative for hematuria.  Musculoskeletal: Negative.  Negative for back pain.  Skin: Negative.  Negative for rash.  Neurological: Positive for weakness. Negative for dizziness, focal weakness and headaches.  Psychiatric/Behavioral: Negative.  The patient is not nervous/anxious.     As per HPI. Otherwise, a complete review of systems is negative.  PAST MEDICAL HISTORY: Past Medical History:  Diagnosis Date   Anemia    IRON DEFICIENCY. REQUIRED FERAHEME INFUSIONS.   Chest pain, unspecified     PAST SURGICAL HISTORY: Past Surgical  History:  Procedure Laterality Date   CESAREAN SECTION     x2   DILATION AND CURETTAGE OF UTERUS     HYSTEROSCOPY WITH D & C N/A 06/21/2019   Procedure: DILATATION AND CURETTAGE /HYSTEROSCOPY, Polypectomy with myosure;  Surgeon: Homero Fellers, MD;  Location: [facility] ORS;  Service: Gynecology;  Laterality: N/A;    FAMILY HISTORY: Family History  Problem Relation Age of Onset   Diabetes Father    Hyperlipidemia Father    Hypertension Father    Cancer Father 46       colon   Colon cancer Father     ADVANCED DIRECTIVES (Y/N):  N  HEALTH MAINTENANCE: Social History   Tobacco Use   Smoking status: Never Smoker   Smokeless tobacco: Never Used  Vaping Use   Vaping Use: Never used  Substance Use Topics   Alcohol use: Yes    Comment: margaritas occasionally   Drug use: No     Colonoscopy:  PAP:  Bone density:  Lipid panel:  Allergies  Allergen Reactions   Adhesive [Tape] Rash    Paper tape okay.  bandaids are a problem.    Current Outpatient Medications  Medication Sig Dispense Refill   medroxyPROGESTERone (PROVERA) 10 MG tablet Take 2 tablets (20 mg total) by mouth in the morning and at bedtime. Take 20 mg twice a day for ten days. Then take 10 mg once a day. 80 tablet 0   Multiple Vitamins-Minerals (WOMENS ONE DAILY PO) Take 1 tablet by mouth daily.      No current facility-administered medications for this visit.    OBJECTIVE: Vitals:   12/13/19 1306  BP: 132/85  Pulse: 70  Resp: 20  Temp: (!) 97.3 F (36.3 C)  SpO2: 98%     Body mass index is 32.21 kg/m.    ECOG FS:0 - Asymptomatic  General: Well-developed, well-nourished, no acute distress. Eyes: Pink conjunctiva, anicteric sclera. HEENT: Normocephalic, moist mucous membranes. Lungs: No audible wheezing or coughing. Heart: Regular rate and rhythm. Abdomen: Soft, nontender, no obvious distention. Musculoskeletal: No edema, cyanosis, or clubbing. Neuro: Alert, answering all  questions appropriately. Cranial nerves grossly intact. Skin: No rashes or petechiae noted. Psych: Normal affect.   LAB RESULTS:  Lab Results  Component Value Date   NA 137 03/02/2019   K 4.2 03/02/2019   CL 104 03/02/2019   CO2 26 03/02/2019   GLUCOSE 83 03/02/2019   BUN 11 03/02/2019   CREATININE 0.70 03/02/2019   CALCIUM 9.4 03/02/2019   PROT 7.3 03/02/2019   AST 15 03/02/2019   ALT 8 03/02/2019   BILITOT 0.3 03/02/2019   GFRNONAA 105 03/02/2019   GFRAA 121 03/02/2019    Lab Results  Component Value Date   WBC 6.0 12/12/2019   NEUTROABS 3.4 12/12/2019   HGB 12.6 12/12/2019   HCT 39.6 12/12/2019   MCV 78.6 (L) 12/12/2019   PLT 295 12/12/2019   Lab Results  Component Value Date   IRON 27 (L) 12/12/2019   TIBC 374 12/12/2019   IRONPCTSAT 7 (L) 12/12/2019   Lab Results  Component Value Date   FERRITIN 6 (L) 12/12/2019     STUDIES: No results found.  ASSESSMENT: Iron deficiency anemia secondary to chronic blood loss.  PLAN:    1.  Iron deficiency anemia: Likely secondary to chronic heavy menses.  Patient's hemoglobin is now within normal limits, but her iron stores remain significantly reduced.  She has been instructed to continue oral iron supplementation and will receive an additional unit of 510 mg IV Feraheme today.  She does not require second infusion.  Return to clinic in 3 months with repeat laboratory work, further evaluation, and continuation of treatment if needed.   2.  Heavy menses: Continue follow-up and treatment per gynecology. 3.  Thrombocytosis: Resolved.  I spent a total of 30 minutes reviewing chart data, face-to-face evaluation with the patient, counseling and coordination of care as detailed above.   Patient expressed understanding and was in agreement with this plan. She also understands that She can call clinic at any time with any questions, concerns, or complaints.    Lloyd Huger, MD   12/14/2019 2:59 PM

## 2019-12-12 ENCOUNTER — Inpatient Hospital Stay: Payer: No Typology Code available for payment source | Attending: Oncology

## 2019-12-12 ENCOUNTER — Encounter: Payer: Self-pay | Admitting: Oncology

## 2019-12-12 ENCOUNTER — Other Ambulatory Visit: Payer: Self-pay

## 2019-12-12 DIAGNOSIS — N920 Excessive and frequent menstruation with regular cycle: Secondary | ICD-10-CM | POA: Insufficient documentation

## 2019-12-12 DIAGNOSIS — D500 Iron deficiency anemia secondary to blood loss (chronic): Secondary | ICD-10-CM | POA: Insufficient documentation

## 2019-12-12 DIAGNOSIS — Z800 Family history of malignant neoplasm of digestive organs: Secondary | ICD-10-CM | POA: Diagnosis not present

## 2019-12-12 LAB — CBC WITH DIFFERENTIAL/PLATELET
Abs Immature Granulocytes: 0.01 10*3/uL (ref 0.00–0.07)
Basophils Absolute: 0 10*3/uL (ref 0.0–0.1)
Basophils Relative: 1 %
Eosinophils Absolute: 0.2 10*3/uL (ref 0.0–0.5)
Eosinophils Relative: 4 %
HCT: 39.6 % (ref 36.0–46.0)
Hemoglobin: 12.6 g/dL (ref 12.0–15.0)
Immature Granulocytes: 0 %
Lymphocytes Relative: 28 %
Lymphs Abs: 1.7 10*3/uL (ref 0.7–4.0)
MCH: 25 pg — ABNORMAL LOW (ref 26.0–34.0)
MCHC: 31.8 g/dL (ref 30.0–36.0)
MCV: 78.6 fL — ABNORMAL LOW (ref 80.0–100.0)
Monocytes Absolute: 0.6 10*3/uL (ref 0.1–1.0)
Monocytes Relative: 10 %
Neutro Abs: 3.4 10*3/uL (ref 1.7–7.7)
Neutrophils Relative %: 57 %
Platelets: 295 10*3/uL (ref 150–400)
RBC: 5.04 MIL/uL (ref 3.87–5.11)
RDW: 18.7 % — ABNORMAL HIGH (ref 11.5–15.5)
Smear Review: ADEQUATE
WBC: 6 10*3/uL (ref 4.0–10.5)
nRBC: 0 % (ref 0.0–0.2)

## 2019-12-12 LAB — FERRITIN: Ferritin: 6 ng/mL — ABNORMAL LOW (ref 11–307)

## 2019-12-12 LAB — IRON AND TIBC
Iron: 27 ug/dL — ABNORMAL LOW (ref 28–170)
Saturation Ratios: 7 % — ABNORMAL LOW (ref 10.4–31.8)
TIBC: 374 ug/dL (ref 250–450)
UIBC: 347 ug/dL

## 2019-12-12 NOTE — Progress Notes (Signed)
Patient states today at appointment she has been feeling very weak. She started her menstrual cycle on 9/13 and is currently still on cycle. Denies other concerns at this time.

## 2019-12-13 ENCOUNTER — Inpatient Hospital Stay: Payer: No Typology Code available for payment source

## 2019-12-13 ENCOUNTER — Inpatient Hospital Stay (HOSPITAL_BASED_OUTPATIENT_CLINIC_OR_DEPARTMENT_OTHER): Payer: No Typology Code available for payment source | Admitting: Oncology

## 2019-12-13 ENCOUNTER — Other Ambulatory Visit: Payer: No Typology Code available for payment source

## 2019-12-13 ENCOUNTER — Encounter: Payer: Self-pay | Admitting: Oncology

## 2019-12-13 VITALS — BP 132/85 | HR 70 | Temp 97.3°F | Resp 20 | Ht 74.5 in | Wt 254.3 lb

## 2019-12-13 VITALS — BP 115/75 | HR 58 | Temp 97.0°F | Resp 18

## 2019-12-13 DIAGNOSIS — D500 Iron deficiency anemia secondary to blood loss (chronic): Secondary | ICD-10-CM | POA: Diagnosis not present

## 2019-12-13 MED ORDER — SODIUM CHLORIDE 0.9 % IV SOLN
510.0000 mg | Freq: Once | INTRAVENOUS | Status: AC
  Administered 2019-12-13: 510 mg via INTRAVENOUS
  Filled 2019-12-13: qty 510

## 2019-12-13 MED ORDER — SODIUM CHLORIDE 0.9 % IV SOLN
Freq: Once | INTRAVENOUS | Status: AC
  Filled 2019-12-13: qty 250

## 2019-12-13 NOTE — Progress Notes (Signed)
Pt tolerated infusion well. Pt declines to stay for 30 minute post observation. No s/s of distress or reaction noted. Pt and VS stable at discharge.

## 2020-02-01 ENCOUNTER — Ambulatory Visit (INDEPENDENT_AMBULATORY_CARE_PROVIDER_SITE_OTHER): Payer: No Typology Code available for payment source | Admitting: Obstetrics and Gynecology

## 2020-02-01 ENCOUNTER — Encounter: Payer: Self-pay | Admitting: Obstetrics and Gynecology

## 2020-02-01 ENCOUNTER — Other Ambulatory Visit: Payer: Self-pay

## 2020-02-01 VITALS — BP 128/72 | Ht 74.5 in | Wt 250.0 lb

## 2020-02-01 DIAGNOSIS — N939 Abnormal uterine and vaginal bleeding, unspecified: Secondary | ICD-10-CM | POA: Diagnosis not present

## 2020-02-01 DIAGNOSIS — N921 Excessive and frequent menstruation with irregular cycle: Secondary | ICD-10-CM

## 2020-02-01 NOTE — Progress Notes (Signed)
Patient ID: Krista Garza, female   DOB: September 29, 1975, 44 y.o.   MRN: [id number]  Reason for Consult: Gynecologic Exam   Referred by Delsa Grana, PA-C  Subjective:     HPI:  Krista Garza is a 44 y.o. female.  She is following up today regarding abnormal uterine bleeding and menorrhagia.  She has been taking Provera on a daily basis to help control her abnormal uterine uterine bleeding and menorrhagia.  However she has continued to have difficulty with bleeding.  She reports that she will sporadically have bouts of heavy excessive bleeding.  When these episodes come she reports loss of large amounts of blood.  She has been treated with iron infusions and has made some progress in improving her anemia however her ferritin remains low at 6.  She is frustrated with this abnormal uterine bleeding and desires to pursue definitive treatment for her abnormal bleeding by hysterectomy.  Past Medical History:  Diagnosis Date  . Anemia    IRON DEFICIENCY. REQUIRED FERAHEME INFUSIONS.  Marland Kitchen Chest pain, unspecified    Family History  Problem Relation Age of Onset  . Diabetes Father   . Hyperlipidemia Father   . Hypertension Father   . Cancer Father 19       colon  . Colon cancer Father    Past Surgical History:  Procedure Laterality Date  . CESAREAN SECTION     x2  . DILATION AND CURETTAGE OF UTERUS    . HYSTEROSCOPY WITH D & C N/A 06/21/2019   Procedure: DILATATION AND CURETTAGE /HYSTEROSCOPY, Polypectomy with myosure;  Surgeon: Homero Fellers, MD;  Location: [facility] ORS;  Service: Gynecology;  Laterality: N/A;    Short Social History:  Social History   Tobacco Use  . Smoking status: Never Smoker  . Smokeless tobacco: Never Used  Substance Use Topics  . Alcohol use: Yes    Comment: margaritas occasionally    Allergies  Allergen Reactions  . Adhesive [Tape] Rash    Paper tape okay.  bandaids are a problem.    Current Outpatient Medications  Medication Sig Dispense Refill    . medroxyPROGESTERone (PROVERA) 10 MG tablet Take 2 tablets (20 mg total) by mouth in the morning and at bedtime. Take 20 mg twice a day for ten days. Then take 10 mg once a day. 80 tablet 0  . Multiple Vitamins-Minerals (WOMENS ONE DAILY PO) Take 1 tablet by mouth daily.      No current facility-administered medications for this visit.    Review of Systems  Constitutional: Negative for chills, fatigue, fever and unexpected weight change.  HENT: Negative for trouble swallowing.  Eyes: Negative for loss of vision.  Respiratory: Negative for cough, shortness of breath and wheezing.  Cardiovascular: Negative for chest pain, leg swelling, palpitations and syncope.  GI: Negative for abdominal pain, blood in stool, diarrhea, nausea and vomiting.  GU: Negative for difficulty urinating, dysuria, frequency and hematuria.  Musculoskeletal: Negative for back pain, leg pain and joint pain.  Skin: Negative for rash.  Neurological: Negative for dizziness, headaches, light-headedness, numbness and seizures.  Psychiatric: Negative for behavioral problem, confusion, depressed mood and sleep disturbance.       Objective:  Objective   Vitals:   02/01/20 1632  BP: 128/72  Weight: 250 lb (113.4 kg)  Height: 6' 2.5" (1.892 m)   Body mass index is 31.67 kg/m.  Physical Exam Vitals and nursing note reviewed.  Constitutional:      Appearance: She is well-developed.  HENT:     Head: Normocephalic and atraumatic.  Eyes:     Pupils: Pupils are equal, round, and reactive to light.  Cardiovascular:     Rate and Rhythm: Normal rate and regular rhythm.  Pulmonary:     Effort: Pulmonary effort is normal. No respiratory distress.  Abdominal:     General: Abdomen is flat.     Palpations: Abdomen is soft.  Skin:    General: Skin is warm and dry.  Neurological:     Mental Status: She is alert and oriented to person, place, and time.  Psychiatric:        Behavior: Behavior normal.        Thought  Content: Thought content normal.        Judgment: Judgment normal.        Assessment/Plan:      44 year old G4 P4 with abnormal uterine bleeding and secondary chronic blood loss anemia.  Patient has been receiving medical therapy for her at abnormal uterine bleeding however she continues to have episodes of heavy bleeding and menorrhagia.  She is aware of her options for endometrial ablation versus hysterectomy.  She desires to pursue hysterectomy.  We had a detailed discussion regarding the anticipated recovery time from a hysterectomy as well as risks of a hysterectomy.  She desires to have this procedure in the beginning of next year.  I have had a careful discussion with this patient about all the options available and the risk/benefits of each. I have fully informed this patient that a hysterectomy may subject her to a variety of discomforts and risks: She understands that most patients have surgery with little difficulty, but problems can happen ranging from minor to fatal. These include nausea, vomiting, pain, bleeding, infection, poor healing, hernia, wound separation, vaginal cuff separation or formation of adhesions. Unexpected reactions may occur from any drug or anesthetic given. Unintended injury may occur to other pelvic or abdominal structures such as Fallopian tubes, ovaries, bladder, ureter (tube from kidney to bladder), or bowel. Nerves going from the pelvis to the legs may be injured. Any such injury may require immediate or later additional surgery to correct the problem. Excessive blood loss requiring transfusion is very unlikely but possible. Dangerous blood clots may form in the legs or lungs. Physical and sexual activity will be restricted in varying degrees for an indeterminate period of time but most often 2-4 weeks. She understands that the plan is to do this laparoscopically, however, there is a chance that this will need to be performed via a larger incision. She may be  hospitalized overnight. Finally, she understands that it is impossible to list every possible undesirable effect and that the condition for which surgery is done is not always cured or significantly improved, and in rare cases may be even worse. I have also counseled her extensively about the pros and cons of ovarian conservation versus removal. Ample time was given to answer all questions.  More than 20 minutes were spent face to face with the patient in the room, reviewing the medical record, labs and images, and coordinating care for the patient. The plan of management was discussed in detail and counseling was provided.     Adrian Prows MD [facility] 02/01/2020 4:59 PM

## 2020-02-01 NOTE — Patient Instructions (Signed)
Laparoscopically Assisted Vaginal Hysterectomy A laparoscopically assisted vaginal hysterectomy (LAVH) is a surgical procedure to remove the uterus and cervix. Sometimes, the ovaries and fallopian tubes are also removed. This surgery may be done to treat problems such as:  Noncancerous growths in the uterus (uterine fibroids) that cause symptoms.  A condition that causes the lining of the uterus to grow in other areas (endometriosis).  Problems with pelvic support.  Cancer of the cervix, ovaries, uterus, or tissue that lines the uterus (endometrium).  Excessive (dysfunctional) uterine bleeding. During an LAVH, some of the surgical removal is done through the vagina, and the rest is done through a few small incisions in the abdomen. This technique may be an option for women who are not able to have a vaginal hysterectomy. Tell a health care provider about:  Any allergies you have.  All medicines you are taking, including vitamins, herbs, eye drops, creams, and over-the-counter medicines.  Any problems you or family members have had with anesthetic medicines.  Any blood disorders you have.  Any surgeries you have had.  Any medical conditions you have.  Whether you are pregnant or may be pregnant. What are the risks? Generally, this is a safe procedure. However, problems may occur, including:  Infection.  Bleeding.  Allergic reactions to medicines.  Damage to other structures or organs.  Difficulty breathing. What happens before the procedure? Staying hydrated Follow instructions from your health care provider about hydration, which may include:  Up to 2 hours before the procedure - you may continue to drink clear liquids, such as water, clear fruit juice, black coffee, and plain tea. Eating and drinking restrictions Follow instructions from your health care provider about eating and drinking, which may include:  8 hours before the procedure - stop eating heavy meals or  foods such as meat, fried foods, or fatty foods.  6 hours before the procedure - stop eating light meals or foods, such as toast or cereal.  6 hours before the procedure - stop drinking milk or drinks that contain milk.  2 hours before the procedure - stop drinking clear liquids. Medicines  Ask your health care provider about: ? Changing or stopping your regular medicines. This is especially important if you are taking diabetes medicines or blood thinners. ? Taking over-the-counter medicines, vitamins, herbs, and supplements. ? Taking medicines such as aspirin and ibuprofen. These medicines can thin your blood. Do not take these medicines unless your health care provider tells you to take them.  You may be asked to take a medicine to empty your colon (bowel preparation).  You may be given antibiotic medicine to help prevent infection. General instructions  Plan to have someone take you home from the hospital or clinic.  Ask your health care provider how your surgical site will be marked or identified.  You may be asked to shower with a germ-killing soap.  Do not use any products that contain nicotine or tobacco, such as cigarettes and e-cigarettes. These can delay healing after surgery. If you need help quitting, ask your health care provider. What happens during the procedure?  To lower your risk of infection: ? Your health care team will wash or sanitize their hands. ? Hair may be removed from the surgical area. ? Your skin will be washed with soap.  An IV will be inserted into one of your veins.  You will be given one or more of the following: ? A medicine to help you relax (sedative). ? A medicine to  make you fall asleep (general anesthetic).  You may have a flexible tube (catheter) put into your bladder to drain urine.  You may have a tube put through your nose or mouth down into your stomach (nasogastric tube). The nasogastric tube will remove digestive fluids and  prevent nausea and vomiting.  Tight-fitting (compression) stockings will be placed on your legs to promote circulation.  Three or four small incisions will be made in your abdomen. An incision will also be made in your vagina.  Probes and tools will be inserted into the small incisions. The uterus and cervix (and possibly the ovaries and fallopian tubes) will be removed through your vagina as well as through the small incisions that were made in the abdomen.  The incisions will then be closed with stitches (sutures). The procedure may vary among health care providers and hospitals. What happens after the procedure?  Your blood pressure, heart rate, breathing rate, and blood oxygen level will be monitored until the medicines you were given have worn off.  You may have a liquid diet at first. You will most likely return to your usual diet the day after surgery.  You will still have the urinary catheter in place. It will likely be removed the day after surgery.  You may have to wear compression stockings. These stockings help to prevent blood clots and reduce swelling in your legs.  You will be encouraged to walk as soon as possible. You will also use a device or do breathing exercises to keep your lungs clear.  Do not drive for 24 hours if you were given a sedative. Summary  A laparoscopically assisted vaginal hysterectomy (LAVH) is a surgical procedure to remove the uterus and cervix, and sometimes the ovaries and fallopian tubes.  Follow instructions from your health care provider about eating and drinking before the procedure.  During an LAVH, some of the surgical removal is done through the vagina, and the rest is done through a few small incisions in the abdomen. This information is not intended to replace advice given to you by your health care provider. Make sure you discuss any questions you have with your health care provider. Document Revised: 05/03/2018 Document Reviewed:  06/05/2016 Elsevier Patient Education  [place].

## 2020-02-01 NOTE — Progress Notes (Signed)
Pt states she wants to talk about getting a hysterotomy.

## 2020-02-08 NOTE — Telephone Encounter (Signed)
Could you please contact this patient about scheduling her hysterectomy? Sounds like she would like to do it next month

## 2020-02-17 ENCOUNTER — Other Ambulatory Visit: Payer: Self-pay

## 2020-02-17 DIAGNOSIS — N939 Abnormal uterine and vaginal bleeding, unspecified: Secondary | ICD-10-CM

## 2020-02-20 ENCOUNTER — Other Ambulatory Visit: Payer: Self-pay

## 2020-02-20 ENCOUNTER — Other Ambulatory Visit: Payer: Self-pay | Admitting: Obstetrics and Gynecology

## 2020-02-20 DIAGNOSIS — N939 Abnormal uterine and vaginal bleeding, unspecified: Secondary | ICD-10-CM

## 2020-02-20 MED ORDER — MEDROXYPROGESTERONE ACETATE 10 MG PO TABS: 20.0000 mg | ORAL_TABLET | Freq: Two times a day (BID) | ORAL | 0 refills | Status: DC

## 2020-02-20 NOTE — Telephone Encounter (Signed)
Resent rx

## 2020-02-20 NOTE — Telephone Encounter (Signed)
Pls advise.  

## 2020-02-20 NOTE — Addendum Note (Signed)
Addended by: Adrian Prows on: 02/20/2020 10:08 AM   Modules accepted: Orders

## 2020-03-06 ENCOUNTER — Other Ambulatory Visit: Payer: Self-pay

## 2020-03-06 ENCOUNTER — Ambulatory Visit (INDEPENDENT_AMBULATORY_CARE_PROVIDER_SITE_OTHER): Payer: No Typology Code available for payment source | Admitting: Obstetrics and Gynecology

## 2020-03-06 ENCOUNTER — Encounter: Payer: Self-pay | Admitting: Obstetrics and Gynecology

## 2020-03-06 VITALS — BP 128/70 | Ht 74.5 in | Wt 251.4 lb

## 2020-03-06 DIAGNOSIS — N939 Abnormal uterine and vaginal bleeding, unspecified: Secondary | ICD-10-CM | POA: Diagnosis not present

## 2020-03-06 DIAGNOSIS — N921 Excessive and frequent menstruation with irregular cycle: Secondary | ICD-10-CM | POA: Diagnosis not present

## 2020-03-06 NOTE — H&P (View-Only) (Signed)
Patient ID: CATRICIA SCHEERER, female   DOB: 04/09/1975, 44 y.o.   MRN: [id number]  Reason for Consult: Pre-op Exam   Referred by Delsa Grana, PA-C  Subjective:     HPI:  ABIGAEL MOGLE is a 44 y.o. female. She is feeling well she has no new complaints. She is following up regarding her AUB and menorrhagia to discuss hysterectomy  Pap smear: 03/29/2019- NIL Endometrial sampling: 06/21/2019- Benign endometrium. No atypia or malignancy  Past Medical History:  Diagnosis Date  . Anemia    IRON DEFICIENCY. REQUIRED FERAHEME INFUSIONS.  Marland Kitchen Chest pain, unspecified    Family History  Problem Relation Age of Onset  . Diabetes Father   . Hyperlipidemia Father   . Hypertension Father   . Cancer Father 12       colon  . Colon cancer Father    Past Surgical History:  Procedure Laterality Date  . CESAREAN SECTION     x2  . DILATION AND CURETTAGE OF UTERUS    . HYSTEROSCOPY WITH D & C N/A 06/21/2019   Procedure: DILATATION AND CURETTAGE /HYSTEROSCOPY, Polypectomy with myosure;  Surgeon: Homero Fellers, MD;  Location: [facility] ORS;  Service: Gynecology;  Laterality: N/A;    Short Social History:  Social History   Tobacco Use  . Smoking status: Never Smoker  . Smokeless tobacco: Never Used  Substance Use Topics  . Alcohol use: Yes    Comment: margaritas occasionally    Allergies  Allergen Reactions  . Adhesive [Tape] Rash    Paper tape okay.  bandaids are a problem.    Current Outpatient Medications  Medication Sig Dispense Refill  . B Complex-C (VITAMIN B + C COMPLEX) TABS Take 1 tablet by mouth daily. With zinc    . medroxyPROGESTERone (PROVERA) 10 MG tablet Take 2 tablets (20 mg total) by mouth in the morning and at bedtime. Take 20 mg twice a day for ten days. Then take 10 mg once a day. (Patient taking differently: Take 10-20 mg by mouth daily. ) 80 tablet 0  . Multiple Vitamins-Minerals (WOMENS ONE DAILY PO) Take 1 tablet by mouth daily. Gummies     No current  facility-administered medications for this visit.    Review of Systems  Constitutional: Negative for chills, fatigue, fever and unexpected weight change.  HENT: Negative for trouble swallowing.  Eyes: Negative for loss of vision.  Respiratory: Negative for cough, shortness of breath and wheezing.  Cardiovascular: Negative for chest pain, leg swelling, palpitations and syncope.  GI: Negative for abdominal pain, blood in stool, diarrhea, nausea and vomiting.  GU: Negative for difficulty urinating, dysuria, frequency and hematuria.  Musculoskeletal: Negative for back pain, leg pain and joint pain.  Skin: Negative for rash.  Neurological: Negative for dizziness, headaches, light-headedness, numbness and seizures.  Psychiatric: Negative for behavioral problem, confusion, depressed mood and sleep disturbance.        Objective:  Objective   Vitals:   03/06/20 0857  BP: 128/70  Weight: 251 lb 6.4 oz (114 kg)  Height: 6' 2.5" (1.892 m)   Body mass index is 31.85 kg/m.  Physical Exam Vitals and nursing note reviewed.  Constitutional:      Appearance: She is well-developed and well-nourished.  HENT:     Head: Normocephalic and atraumatic.  Eyes:     Extraocular Movements: EOM normal.     Pupils: Pupils are equal, round, and reactive to light.  Cardiovascular:     Rate and Rhythm: Normal rate  and regular rhythm.  Pulmonary:     Effort: Pulmonary effort is normal. No respiratory distress.  Skin:    General: Skin is warm and dry.  Neurological:     Mental Status: She is alert and oriented to person, place, and time.  Psychiatric:        Mood and Affect: Mood and affect normal.        Behavior: Behavior normal.        Thought Content: Thought content normal.        Judgment: Judgment normal.     Assessment/Plan:     44 yo with menorrhagia and abnormal persistent AUB. Desires definitive therapy by hysterectomy.  I have had a careful discussion with this patient about all  the options available and the risk/benefits of each. I have fully informed this patient that a hysterectomy may subject her to a variety of discomforts and risks: She understands that most patients have surgery with little difficulty, but problems can happen ranging from minor to fatal. These include nausea, vomiting, pain, bleeding, infection, poor healing, hernia, wound separation, vaginal cuff separation or formation of adhesions. Unexpected reactions may occur from any drug or anesthetic given. Unintended injury may occur to other pelvic or abdominal structures such as Fallopian tubes, ovaries, bladder, ureter (tube from kidney to bladder), or bowel. Nerves going from the pelvis to the legs may be injured. Any such injury may require immediate or later additional surgery to correct the problem. Excessive blood loss requiring transfusion is very unlikely but possible. Dangerous blood clots may form in the legs or lungs. Physical and sexual activity will be restricted in varying degrees for an indeterminate period of time but most often 2-4 weeks. She understands that the plan is to do this laparoscopically, however, there is a chance that this will need to be performed via a larger incision. She may be hospitalized overnight. Finally, she understands that it is impossible to list every possible undesirable effect and that the condition for which surgery is done is not always cured or significantly improved, and in rare cases may be even worse. I have also counseled her extensively about the pros and cons of ovarian conservation versus removal. Ample time was given to answer all questions.  More than 30 minutes were spent face to face with the patient in the room, reviewing the medical record, labs and images, and coordinating care for the patient. The plan of management was discussed in detail and counseling was provided.      Adrian Prows MD [facility] 03/06/2020 9:38 AM

## 2020-03-06 NOTE — Progress Notes (Signed)
H&P Pre OP

## 2020-03-06 NOTE — Progress Notes (Signed)
Patient ID: Krista Garza, female   DOB: 1975-06-11, 44 y.o.   MRN: [id number]  Reason for Consult: Pre-op Exam   Referred by Delsa Grana, PA-C  Subjective:     HPI:  Krista Garza is a 44 y.o. female. She is feeling well she has no new complaints. She is following up regarding her AUB and menorrhagia to discuss hysterectomy  Pap smear: 03/29/2019- NIL Endometrial sampling: 06/21/2019- Benign endometrium. No atypia or malignancy  Past Medical History:  Diagnosis Date  . Anemia    IRON DEFICIENCY. REQUIRED FERAHEME INFUSIONS.  Marland Kitchen Chest pain, unspecified    Family History  Problem Relation Age of Onset  . Diabetes Father   . Hyperlipidemia Father   . Hypertension Father   . Cancer Father 8       colon  . Colon cancer Father    Past Surgical History:  Procedure Laterality Date  . CESAREAN SECTION     x2  . DILATION AND CURETTAGE OF UTERUS    . HYSTEROSCOPY WITH D & C N/A 06/21/2019   Procedure: DILATATION AND CURETTAGE /HYSTEROSCOPY, Polypectomy with myosure;  Surgeon: Homero Fellers, MD;  Location: [facility] ORS;  Service: Gynecology;  Laterality: N/A;    Short Social History:  Social History   Tobacco Use  . Smoking status: Never Smoker  . Smokeless tobacco: Never Used  Substance Use Topics  . Alcohol use: Yes    Comment: margaritas occasionally    Allergies  Allergen Reactions  . Adhesive [Tape] Rash    Paper tape okay.  bandaids are a problem.    Current Outpatient Medications  Medication Sig Dispense Refill  . B Complex-C (VITAMIN B + C COMPLEX) TABS Take 1 tablet by mouth daily. With zinc    . medroxyPROGESTERone (PROVERA) 10 MG tablet Take 2 tablets (20 mg total) by mouth in the morning and at bedtime. Take 20 mg twice a day for ten days. Then take 10 mg once a day. (Patient taking differently: Take 10-20 mg by mouth daily. ) 80 tablet 0  . Multiple Vitamins-Minerals (WOMENS ONE DAILY PO) Take 1 tablet by mouth daily. Gummies     No current  facility-administered medications for this visit.    Review of Systems  Constitutional: Negative for chills, fatigue, fever and unexpected weight change.  HENT: Negative for trouble swallowing.  Eyes: Negative for loss of vision.  Respiratory: Negative for cough, shortness of breath and wheezing.  Cardiovascular: Negative for chest pain, leg swelling, palpitations and syncope.  GI: Negative for abdominal pain, blood in stool, diarrhea, nausea and vomiting.  GU: Negative for difficulty urinating, dysuria, frequency and hematuria.  Musculoskeletal: Negative for back pain, leg pain and joint pain.  Skin: Negative for rash.  Neurological: Negative for dizziness, headaches, light-headedness, numbness and seizures.  Psychiatric: Negative for behavioral problem, confusion, depressed mood and sleep disturbance.        Objective:  Objective   Vitals:   03/06/20 0857  BP: 128/70  Weight: 251 lb 6.4 oz (114 kg)  Height: 6' 2.5" (1.892 m)   Body mass index is 31.85 kg/m.  Physical Exam Vitals and nursing note reviewed.  Constitutional:      Appearance: She is well-developed and well-nourished.  HENT:     Head: Normocephalic and atraumatic.  Eyes:     Extraocular Movements: EOM normal.     Pupils: Pupils are equal, round, and reactive to light.  Cardiovascular:     Rate and Rhythm: Normal rate  and regular rhythm.  Pulmonary:     Effort: Pulmonary effort is normal. No respiratory distress.  Skin:    General: Skin is warm and dry.  Neurological:     Mental Status: She is alert and oriented to person, place, and time.  Psychiatric:        Mood and Affect: Mood and affect normal.        Behavior: Behavior normal.        Thought Content: Thought content normal.        Judgment: Judgment normal.     Assessment/Plan:     44 yo with menorrhagia and abnormal persistent AUB. Desires definitive therapy by hysterectomy.  I have had a careful discussion with this patient about all  the options available and the risk/benefits of each. I have fully informed this patient that a hysterectomy may subject her to a variety of discomforts and risks: She understands that most patients have surgery with little difficulty, but problems can happen ranging from minor to fatal. These include nausea, vomiting, pain, bleeding, infection, poor healing, hernia, wound separation, vaginal cuff separation or formation of adhesions. Unexpected reactions may occur from any drug or anesthetic given. Unintended injury may occur to other pelvic or abdominal structures such as Fallopian tubes, ovaries, bladder, ureter (tube from kidney to bladder), or bowel. Nerves going from the pelvis to the legs may be injured. Any such injury may require immediate or later additional surgery to correct the problem. Excessive blood loss requiring transfusion is very unlikely but possible. Dangerous blood clots may form in the legs or lungs. Physical and sexual activity will be restricted in varying degrees for an indeterminate period of time but most often 2-4 weeks. She understands that the plan is to do this laparoscopically, however, there is a chance that this will need to be performed via a larger incision. She may be hospitalized overnight. Finally, she understands that it is impossible to list every possible undesirable effect and that the condition for which surgery is done is not always cured or significantly improved, and in rare cases may be even worse. I have also counseled her extensively about the pros and cons of ovarian conservation versus removal. Ample time was given to answer all questions.  More than 30 minutes were spent face to face with the patient in the room, reviewing the medical record, labs and images, and coordinating care for the patient. The plan of management was discussed in detail and counseling was provided.      Adrian Prows MD [facility] 03/06/2020 9:38 AM

## 2020-03-06 NOTE — Patient Instructions (Signed)
Total Laparoscopic Hysterectomy A total laparoscopic hysterectomy is a minimally invasive surgery to remove the uterus and cervix. The fallopian tubes and ovaries can also be removed (bilateral salpingo-oophorectomy) during this surgery, if necessary. This procedure may be done to treat problems such as:  Noncancerous growths in the uterus (uterine fibroids) that cause symptoms.  A condition that causes the lining of the uterus (endometrium) to grow in other areas (endometriosis).  Problems with pelvic support. This is caused by weakened muscles of the pelvis following vaginal childbirth or menopause.  Cancer of the cervix, ovaries, uterus, or endometrium.  Excessive (dysfunctional) uterine bleeding. This surgery is performed by inserting a thin, lighted tube (laparoscope) and surgical instruments into small incisions in the abdomen. The laparoscope sends images to a monitor. The images help the health care provider perform the procedure. After this procedure, you will no longer be able to have a baby, and you will no longer have a menstrual period. Tell a health care provider about:  Any allergies you have.  All medicines you are taking, including vitamins, herbs, eye drops, creams, and over-the-counter medicines.  Any problems you or family members have had with anesthetic medicines.  Any blood disorders you have.  Any surgeries you have had.  Any medical conditions you have.  Whether you are pregnant or may be pregnant. What are the risks? Generally, this is a safe procedure. However, problems may occur, including:  Infection.  Bleeding.  Blood clots in the legs or lungs.  Allergic reactions to medicines.  Damage to other structures or organs.  The risk that the surgery may have to be switched to the regular one in which a large incision is made in the abdomen (abdominal hysterectomy). What happens before the procedure? Staying hydrated Follow instructions from your  health care provider about hydration, which may include:  Up to 2 hours before the procedure - you may continue to drink clear liquids, such as water, clear fruit juice, black coffee, and plain tea Eating and drinking restrictions Follow instructions from your health care provider about eating and drinking, which may include:  8 hours before the procedure - stop eating heavy meals or foods such as meat, fried foods, or fatty foods.  6 hours before the procedure - stop eating light meals or foods, such as toast or cereal.  6 hours before the procedure - stop drinking milk or drinks that contain milk.  2 hours before the procedure - stop drinking clear liquids. Medicines  Ask your health care provider about: ? Changing or stopping your regular medicines. This is especially important if you are taking diabetes medicines or blood thinners. ? Taking over-the-counter medicines, vitamins, herbs, and supplements. ? Taking medicines such as aspirin and ibuprofen. These medicines can thin your blood. Do not take these medicines unless your health care provider tells you to take them.  You may be given antibiotic medicine to help prevent infection.  You may be asked to take laxatives.  You may be given medicines to help prevent nausea and vomiting after the procedure. General instructions  Ask your health care provider how your surgical site will be marked or identified.  You may be asked to shower with a germ-killing soap.  Do not use any products that contain nicotine or tobacco, such as cigarettes and e-cigarettes. If you need help quitting, ask your health care provider.  You may have an exam or testing, such as an ultrasound to determine the size and shape of your pelvic organs.    You may have a blood or urine sample taken.  This procedure can affect the way you feel about yourself. Talk with your health care provider about the physical and emotional changes hysterectomy may  cause.  Plan to have someone take you home from the hospital or clinic.  Plan to have a responsible adult care for you for at least 24 hours after you leave the hospital or clinic. This is important. What happens during the procedure?  To lower your risk of infection: ? Your health care team will wash or sanitize their hands. ? Your skin will be washed with soap. ? Hair may be removed from the surgical area.  An IV will be inserted into one of your veins.  You will be given one or more of the following: ? A medicine to help you relax (sedative). ? A medicine to make you fall asleep (general anesthetic).  You will be given antibiotic medicine through your IV.  A tube may be inserted down your throat to help you breathe during the procedure.  A gas (carbon dioxide) will be used to inflate your abdomen to allow your surgeon to see inside of your abdomen.  Three or four small incisions will be made in your abdomen.  A laparoscope will be inserted into one of your incisions. Surgical instruments will be inserted through the other incisions in order to perform the procedure.  Your uterus and cervix may be removed through your vagina or cut into small pieces and removed through the small incisions. Any other organs that need to be removed will also be removed this way.  Carbon dioxide will be released from inside of your abdomen.  Your incisions will be closed with stitches (sutures).  A bandage (dressing) may be placed over your incisions. The procedure may vary among health care providers and hospitals. What happens after the procedure?  Your blood pressure, heart rate, breathing rate, and blood oxygen level will be monitored until the medicines you were given have worn off.  You will be given medicine for pain and nausea as needed.  Do not drive for 24 hours if you received a sedative. Summary  Total Laparoscopic hysterectomy is a procedure to remove your uterus, cervix and  sometimes the fallopian tubes and ovaries.  This procedure can affect the way you feel about yourself. Talk with your health care provider about the physical and emotional changes hysterectomy may cause.  After this procedure, you will no longer be able to have a baby, and you will no longer have a menstrual period.  You will be given pain medicine to control discomfort after this procedure. This information is not intended to replace advice given to you by your health care provider. Make sure you discuss any questions you have with your health care provider. Document Revised: 02/20/2017 Document Reviewed: 05/21/2016 Elsevier Patient Education  2020 [employer].

## 2020-03-07 ENCOUNTER — Encounter
Admission: RE | Admit: 2020-03-07 | Discharge: 2020-03-07 | Disposition: A | Discharge status: Home / Self Care | Payer: No Typology Code available for payment source | Source: Ambulatory Visit | Attending: Obstetrics and Gynecology | Admitting: Obstetrics and Gynecology

## 2020-03-07 DIAGNOSIS — Z01812 Encounter for preprocedural laboratory examination: Secondary | ICD-10-CM | POA: Insufficient documentation

## 2020-03-07 NOTE — Patient Instructions (Signed)
Your procedure is scheduled on: Tuesday 03/13/20.  Report to THE FIRST FLOOR REGISTRATION DESK IN THE MEDICAL MALL ON THE MORNING OF SURGERY FIRST, THEN YOU WILL CHECK IN AT THE SURGERY INFORMATION DESK LOCATED OUTSIDE THE SAME DAY SURGERY DEPARTMENT LOCATED ON 2ND FLOOR MEDICAL MALL ENTRANCE.   To find out your arrival time please call [phone] between 1PM - 3PM on Monday 03/12/20.   Remember: Instructions that are not followed completely may result in serious medical risk, up to and including death, or upon the discretion of your surgeon and anesthesiologist your surgery may need to be rescheduled.     __X__ 1. Do not eat food after midnight the night before your procedure.                 No gum chewing or hard candies. You may drink clear liquids up to 2 hours                 before you are scheduled to arrive for your surgery- DO NOT drink clear                 liquids within 2 hours of the start of your surgery.                 Clear Liquids include:  water, apple juice without pulp, clear carbohydrate                 drink such as Clearfast or Gatorade, Black Coffee or Tea (Do not add                 milk or creamer to coffee or tea).  ** Dr. Gilman Schmidt would like for you to finish your Clear Pre-Surgery Ensure 2 hours prior to your arrival on the morning of surgery. **    __X__2.  On the morning of surgery brush your teeth with toothpaste and water, you may rinse your mouth with mouthwash if you wish.  Do not swallow any toothpaste or mouthwash.    __X__ 3.  No Alcohol for 24 hours before or after surgery.  __X__ 4.  Do Not Smoke or use e-cigarettes For 24 Hours Prior to Your Surgery.                 Do not use any chewable tobacco products for at least 6 hours prior to                 surgery.  __X__5.  Notify your doctor if there is any change in your medical condition      (cold, fever, infections).      Do NOT wear jewelry, make-up, hairpins, clips or nail polish. Do  NOT wear lotions, powders, or perfumes.  Do NOT shave 48 hours prior to surgery. Men may shave face and neck. Do NOT bring valuables to the hospital.     [facility] is not responsible for any belongings or valuables.   Contacts, dentures/partials or body piercings may not be worn into surgery. Bring a case for your contacts, glasses or hearing aids, a denture cup will be supplied.    Patients discharged the day of surgery will not be allowed to drive home.    __X__ Take these medicines the morning of surgery with A SIP OF WATER:     1. medroxyPROGESTERone (PROVERA)      __X__ Use CHG Soap as directed  __X__ Stop Anti-inflammatories 7 days before surgery such as Advil, Ibuprofen, Motrin,  BC or Goodies Powder, Naprosyn, Naproxen, Aleve, Aspirin, Meloxicam. May take Tylenol if needed for pain or discomfort.   __X__Do not start taking any new herbal supplements or vitamins prior to your procedure.     Wear comfortable clothing (specific to your surgery type) to the hospital.  Plan for stool softeners for home use; pain medications have a tendency to cause constipation. You can also help prevent constipation by eating foods high in fiber such as fruits and vegetables and drinking plenty of fluids as your diet allows.  After surgery, you can prevent lung complications by doing breathing exercises.Take deep breaths and cough every 1-2 hours. Your doctor may order a device called an Incentive Spirometer to help you take deep breaths.  Please call the [facility] at [phone] if you have any questions about these instructions.

## 2020-03-09 ENCOUNTER — Other Ambulatory Visit
Admission: RE | Admit: 2020-03-09 | Discharge: 2020-03-09 | Disposition: A | Discharge status: Home / Self Care | Payer: No Typology Code available for payment source | Source: Ambulatory Visit | Attending: Obstetrics and Gynecology | Admitting: Obstetrics and Gynecology

## 2020-03-09 ENCOUNTER — Other Ambulatory Visit: Payer: Self-pay

## 2020-03-09 DIAGNOSIS — Z01812 Encounter for preprocedural laboratory examination: Secondary | ICD-10-CM | POA: Diagnosis not present

## 2020-03-09 DIAGNOSIS — Z20822 Contact with and (suspected) exposure to covid-19: Secondary | ICD-10-CM | POA: Diagnosis not present

## 2020-03-09 LAB — TYPE AND SCREEN
ABO/RH(D): B POS
Antibody Screen: NEGATIVE

## 2020-03-09 LAB — CBC
HCT: 34 % — ABNORMAL LOW (ref 36.0–46.0)
Hemoglobin: 10.2 g/dL — ABNORMAL LOW (ref 12.0–15.0)
MCH: 22.5 pg — ABNORMAL LOW (ref 26.0–34.0)
MCHC: 30 g/dL (ref 30.0–36.0)
MCV: 75.1 fL — ABNORMAL LOW (ref 80.0–100.0)
Platelets: 476 10*3/uL — ABNORMAL HIGH (ref 150–400)
RBC: 4.53 MIL/uL (ref 3.87–5.11)
RDW: 15.9 % — ABNORMAL HIGH (ref 11.5–15.5)
WBC: 5.9 10*3/uL (ref 4.0–10.5)
nRBC: 0 % (ref 0.0–0.2)

## 2020-03-09 LAB — SARS CORONAVIRUS 2 (TAT 6-24 HRS): SARS Coronavirus 2: NEGATIVE

## 2020-03-13 ENCOUNTER — Encounter
Admission: RE | Disposition: A | Discharge status: Home / Self Care | Payer: Self-pay | Source: Ambulatory Visit | Attending: Obstetrics and Gynecology

## 2020-03-13 ENCOUNTER — Ambulatory Visit: Payer: No Typology Code available for payment source | Admitting: Certified Registered"

## 2020-03-13 ENCOUNTER — Other Ambulatory Visit: Payer: Self-pay | Admitting: Obstetrics and Gynecology

## 2020-03-13 ENCOUNTER — Encounter: Payer: Self-pay | Admitting: Obstetrics and Gynecology

## 2020-03-13 ENCOUNTER — Ambulatory Visit
Admission: RE | Admit: 2020-03-13 | Discharge: 2020-03-13 | Disposition: A | Discharge status: Home / Self Care | Payer: No Typology Code available for payment source | Source: Ambulatory Visit | Attending: Obstetrics and Gynecology | Admitting: Obstetrics and Gynecology

## 2020-03-13 ENCOUNTER — Other Ambulatory Visit: Payer: Self-pay

## 2020-03-13 DIAGNOSIS — Z800 Family history of malignant neoplasm of digestive organs: Secondary | ICD-10-CM | POA: Diagnosis not present

## 2020-03-13 DIAGNOSIS — N809 Endometriosis, unspecified: Secondary | ICD-10-CM

## 2020-03-13 DIAGNOSIS — N858 Other specified noninflammatory disorders of uterus: Secondary | ICD-10-CM

## 2020-03-13 DIAGNOSIS — N888 Other specified noninflammatory disorders of cervix uteri: Secondary | ICD-10-CM | POA: Diagnosis not present

## 2020-03-13 DIAGNOSIS — N800 Endometriosis of uterus: Secondary | ICD-10-CM

## 2020-03-13 DIAGNOSIS — N939 Abnormal uterine and vaginal bleeding, unspecified: Secondary | ICD-10-CM | POA: Diagnosis not present

## 2020-03-13 DIAGNOSIS — Z793 Long term (current) use of hormonal contraceptives: Secondary | ICD-10-CM | POA: Diagnosis not present

## 2020-03-13 DIAGNOSIS — Z79899 Other long term (current) drug therapy: Secondary | ICD-10-CM | POA: Insufficient documentation

## 2020-03-13 DIAGNOSIS — N838 Other noninflammatory disorders of ovary, fallopian tube and broad ligament: Secondary | ICD-10-CM | POA: Diagnosis not present

## 2020-03-13 DIAGNOSIS — Z888 Allergy status to other drugs, medicaments and biological substances status: Secondary | ICD-10-CM | POA: Diagnosis not present

## 2020-03-13 DIAGNOSIS — N921 Excessive and frequent menstruation with irregular cycle: Secondary | ICD-10-CM | POA: Diagnosis not present

## 2020-03-13 HISTORY — PX: CYSTOSCOPY: SHX5120

## 2020-03-13 HISTORY — PX: TOTAL LAPAROSCOPIC HYSTERECTOMY WITH SALPINGECTOMY: SHX6742

## 2020-03-13 LAB — POCT PREGNANCY, URINE: Preg Test, Ur: NEGATIVE

## 2020-03-13 SURGERY — HYSTERECTOMY, TOTAL, LAPAROSCOPIC, WITH SALPINGECTOMY
Anesthesia: General

## 2020-03-13 MED ORDER — FENTANYL CITRATE (PF) 100 MCG/2ML IJ SOLN
INTRAMUSCULAR | Status: AC
  Administered 2020-03-13: 12:00:00 25 ug via INTRAVENOUS
  Filled 2020-03-13: qty 2

## 2020-03-13 MED ORDER — SUGAMMADEX SODIUM 500 MG/5ML IV SOLN
INTRAVENOUS | Status: AC
  Filled 2020-03-13: qty 5

## 2020-03-13 MED ORDER — PROPOFOL 500 MG/50ML IV EMUL
INTRAVENOUS | Status: AC
  Filled 2020-03-13: qty 50

## 2020-03-13 MED ORDER — OXYCODONE HCL 5 MG PO TABS
5.0000 mg | ORAL_TABLET | Freq: Once | ORAL | Status: AC
  Administered 2020-03-13: 5 mg via ORAL

## 2020-03-13 MED ORDER — ACETAMINOPHEN 10 MG/ML IV SOLN
INTRAVENOUS | Status: AC
  Filled 2020-03-13: qty 100

## 2020-03-13 MED ORDER — OXYCODONE HCL 5 MG PO TABS: 5.0000 mg | ORAL_TABLET | Freq: Once | ORAL | Status: AC

## 2020-03-13 MED ORDER — CEFAZOLIN SODIUM-DEXTROSE 2-4 GM/100ML-% IV SOLN
INTRAVENOUS | Status: AC
  Filled 2020-03-13: qty 100

## 2020-03-13 MED ORDER — PROMETHAZINE HCL 25 MG/ML IJ SOLN
6.2500 mg | INTRAMUSCULAR | Status: DC | PRN
Start: 2020-03-13 — End: 2020-03-13

## 2020-03-13 MED ORDER — LACTATED RINGERS IV SOLN: INTRAVENOUS | Status: DC

## 2020-03-13 MED ORDER — FAMOTIDINE 20 MG PO TABS: 20.0000 mg | ORAL_TABLET | Freq: Once | ORAL | Status: AC

## 2020-03-13 MED ORDER — DEXMEDETOMIDINE (PRECEDEX) IN NS 20 MCG/5ML (4 MCG/ML) IV SYRINGE
PREFILLED_SYRINGE | INTRAVENOUS | Status: AC
  Filled 2020-03-13: qty 5

## 2020-03-13 MED ORDER — ORAL CARE MOUTH RINSE: 15.0000 mL | Freq: Once | OROMUCOSAL | Status: AC

## 2020-03-13 MED ORDER — BUPIVACAINE HCL (PF) 0.5 % IJ SOLN
INTRAMUSCULAR | Status: AC
  Filled 2020-03-13: qty 30

## 2020-03-13 MED ORDER — CHLORHEXIDINE GLUCONATE 0.12 % MT SOLN: 15.0000 mL | Freq: Once | OROMUCOSAL | Status: AC

## 2020-03-13 MED ORDER — FENTANYL CITRATE (PF) 100 MCG/2ML IJ SOLN
25.0000 ug | INTRAMUSCULAR | Status: DC | PRN
  Administered 2020-03-13 (×2): 25 ug via INTRAVENOUS

## 2020-03-13 MED ORDER — KETOROLAC TROMETHAMINE 30 MG/ML IJ SOLN
INTRAMUSCULAR | Status: AC
  Filled 2020-03-13: qty 1

## 2020-03-13 MED ORDER — HYDROMORPHONE HCL 1 MG/ML IJ SOLN
INTRAMUSCULAR | Status: DC | PRN
  Administered 2020-03-13 (×2): .5 mg via INTRAVENOUS

## 2020-03-13 MED ORDER — SUGAMMADEX SODIUM 500 MG/5ML IV SOLN
INTRAVENOUS | Status: DC | PRN
  Administered 2020-03-13: 500 mg via INTRAVENOUS

## 2020-03-13 MED ORDER — OXYCODONE HCL 5 MG PO TABS
ORAL_TABLET | ORAL | Status: AC
  Filled 2020-03-13: qty 1

## 2020-03-13 MED ORDER — EPHEDRINE SULFATE 50 MG/ML IJ SOLN
INTRAMUSCULAR | Status: DC | PRN
  Administered 2020-03-13 (×2): 5 mg via INTRAVENOUS

## 2020-03-13 MED ORDER — FENTANYL CITRATE (PF) 100 MCG/2ML IJ SOLN
INTRAMUSCULAR | Status: DC | PRN
  Administered 2020-03-13 (×4): 50 ug via INTRAVENOUS

## 2020-03-13 MED ORDER — PHENYLEPHRINE HCL (PRESSORS) 10 MG/ML IV SOLN
INTRAVENOUS | Status: AC
  Filled 2020-03-13: qty 1

## 2020-03-13 MED ORDER — PROPOFOL 10 MG/ML IV BOLUS
INTRAVENOUS | Status: DC | PRN
  Administered 2020-03-13: 200 mg via INTRAVENOUS

## 2020-03-13 MED ORDER — KETOROLAC TROMETHAMINE 30 MG/ML IJ SOLN
INTRAMUSCULAR | Status: DC | PRN
  Administered 2020-03-13: 30 mg via INTRAVENOUS

## 2020-03-13 MED ORDER — HYDROMORPHONE HCL 1 MG/ML IJ SOLN
INTRAMUSCULAR | Status: AC
  Filled 2020-03-13: qty 1

## 2020-03-13 MED ORDER — GLYCOPYRROLATE 0.2 MG/ML IJ SOLN
INTRAMUSCULAR | Status: DC | PRN
  Administered 2020-03-13: .2 mg via INTRAVENOUS

## 2020-03-13 MED ORDER — OXYCODONE HCL 5 MG PO TABS
ORAL_TABLET | ORAL | Status: AC
  Administered 2020-03-13: 12:00:00 5 mg via ORAL
  Filled 2020-03-13: qty 1

## 2020-03-13 MED ORDER — GLYCOPYRROLATE 0.2 MG/ML IJ SOLN
INTRAMUSCULAR | Status: AC
  Filled 2020-03-13: qty 1

## 2020-03-13 MED ORDER — IBUPROFEN 600 MG PO TABS: 600.0000 mg | ORAL_TABLET | Freq: Four times a day (QID) | ORAL | 0 refills | Status: DC | PRN

## 2020-03-13 MED ORDER — MIDAZOLAM HCL 2 MG/2ML IJ SOLN
INTRAMUSCULAR | Status: DC | PRN
  Administered 2020-03-13: 2 mg via INTRAVENOUS

## 2020-03-13 MED ORDER — MIDAZOLAM HCL 2 MG/2ML IJ SOLN
INTRAMUSCULAR | Status: AC
  Filled 2020-03-13: qty 2

## 2020-03-13 MED ORDER — BUPIVACAINE LIPOSOME 1.3 % IJ SUSP
INTRAMUSCULAR | Status: DC | PRN
  Administered 2020-03-13: 15 mL
  Administered 2020-03-13: 5 mL

## 2020-03-13 MED ORDER — ROCURONIUM BROMIDE 100 MG/10ML IV SOLN
INTRAVENOUS | Status: DC | PRN
  Administered 2020-03-13: 30 mg via INTRAVENOUS
  Administered 2020-03-13: 20 mg via INTRAVENOUS
  Administered 2020-03-13: 50 mg via INTRAVENOUS
  Administered 2020-03-13: 20 mg via INTRAVENOUS

## 2020-03-13 MED ORDER — CEFAZOLIN SODIUM-DEXTROSE 2-4 GM/100ML-% IV SOLN
2.0000 g | INTRAVENOUS | Status: AC
  Administered 2020-03-13: 08:00:00 2 g via INTRAVENOUS

## 2020-03-13 MED ORDER — POVIDONE-IODINE 10 % EX SWAB
2.0000 "application " | Freq: Once | CUTANEOUS | Status: AC
  Administered 2020-03-13: 2 via TOPICAL

## 2020-03-13 MED ORDER — FAMOTIDINE 20 MG PO TABS
ORAL_TABLET | ORAL | Status: AC
  Administered 2020-03-13: 07:00:00 20 mg via ORAL
  Filled 2020-03-13: qty 1

## 2020-03-13 MED ORDER — FENTANYL CITRATE (PF) 100 MCG/2ML IJ SOLN
INTRAMUSCULAR | Status: AC
  Filled 2020-03-13: qty 2

## 2020-03-13 MED ORDER — FENTANYL CITRATE (PF) 100 MCG/2ML IJ SOLN
INTRAMUSCULAR | Status: AC
  Administered 2020-03-13: 11:00:00 50 ug via INTRAVENOUS
  Filled 2020-03-13: qty 2

## 2020-03-13 MED ORDER — DEXAMETHASONE SODIUM PHOSPHATE 10 MG/ML IJ SOLN
INTRAMUSCULAR | Status: AC
  Filled 2020-03-13: qty 1

## 2020-03-13 MED ORDER — ONDANSETRON HCL 4 MG/2ML IJ SOLN
INTRAMUSCULAR | Status: AC
  Filled 2020-03-13: qty 4

## 2020-03-13 MED ORDER — DEXMEDETOMIDINE (PRECEDEX) IN NS 20 MCG/5ML (4 MCG/ML) IV SYRINGE
PREFILLED_SYRINGE | INTRAVENOUS | Status: DC | PRN
  Administered 2020-03-13: 8 ug via INTRAVENOUS
  Administered 2020-03-13: 12 ug via INTRAVENOUS

## 2020-03-13 MED ORDER — ACETAMINOPHEN 500 MG PO TABS: 1000.0000 mg | ORAL_TABLET | Freq: Four times a day (QID) | ORAL | 0 refills | Status: DC | PRN

## 2020-03-13 MED ORDER — LIDOCAINE HCL (PF) 2 % IJ SOLN
INTRAMUSCULAR | Status: AC
  Filled 2020-03-13: qty 5

## 2020-03-13 MED ORDER — OXYCODONE HCL 5 MG PO TABS: 5.0000 mg | ORAL_TABLET | Freq: Three times a day (TID) | ORAL | 0 refills | Status: DC | PRN

## 2020-03-13 MED ORDER — DEXAMETHASONE SODIUM PHOSPHATE 10 MG/ML IJ SOLN
INTRAMUSCULAR | Status: DC | PRN
  Administered 2020-03-13: 10 mg via INTRAVENOUS

## 2020-03-13 MED ORDER — ROCURONIUM BROMIDE 10 MG/ML (PF) SYRINGE
PREFILLED_SYRINGE | INTRAVENOUS | Status: AC
  Filled 2020-03-13: qty 20

## 2020-03-13 MED ORDER — LIDOCAINE HCL (CARDIAC) PF 100 MG/5ML IV SOSY
PREFILLED_SYRINGE | INTRAVENOUS | Status: DC | PRN
  Administered 2020-03-13: 100 mg via INTRAVENOUS

## 2020-03-13 MED ORDER — EPHEDRINE 5 MG/ML INJ
INTRAVENOUS | Status: AC
  Filled 2020-03-13: qty 10

## 2020-03-13 MED ORDER — ONDANSETRON HCL 4 MG/2ML IJ SOLN
INTRAMUSCULAR | Status: DC | PRN
  Administered 2020-03-13 (×2): 4 mg via INTRAVENOUS

## 2020-03-13 MED ORDER — BUPIVACAINE LIPOSOME 1.3 % IJ SUSP
INTRAMUSCULAR | Status: AC
  Filled 2020-03-13: qty 20

## 2020-03-13 MED ORDER — ACETAMINOPHEN 10 MG/ML IV SOLN
INTRAVENOUS | Status: DC | PRN
  Administered 2020-03-13: 1000 mg via INTRAVENOUS

## 2020-03-13 MED ORDER — CHLORHEXIDINE GLUCONATE 0.12 % MT SOLN
OROMUCOSAL | Status: AC
  Administered 2020-03-13: 07:00:00 15 mL via OROMUCOSAL
  Filled 2020-03-13: qty 15

## 2020-03-13 SURGICAL SUPPLY — 54 items
ADH SKN CLS APL DERMABOND .7 (GAUZE/BANDAGES/DRESSINGS) ×2
APL PRP STRL LF DISP 70% ISPRP (MISCELLANEOUS) ×2
BAG DRN RND TRDRP ANRFLXCHMBR (UROLOGICAL SUPPLIES) ×2
BAG URINE DRAIN 2000ML AR STRL (UROLOGICAL SUPPLIES) ×3 IMPLANT
BLADE SURG SZ11 CARB STEEL (BLADE) ×3 IMPLANT
CATH FOLEY 2WAY  5CC 16FR (CATHETERS) ×1
CATH FOLEY 2WAY 5CC 16FR (CATHETERS) ×2
CATH URTH 16FR FL 2W BLN LF (CATHETERS) ×2 IMPLANT
CHLORAPREP W/TINT 26 (MISCELLANEOUS) ×3 IMPLANT
DEFOGGER SCOPE WARMER CLEARIFY (MISCELLANEOUS) ×3 IMPLANT
DERMABOND ADVANCED (GAUZE/BANDAGES/DRESSINGS) ×1
DERMABOND ADVANCED .7 DNX12 (GAUZE/BANDAGES/DRESSINGS) ×2 IMPLANT
DEVICE SUTURE ENDOST 10MM (ENDOMECHANICALS) ×3 IMPLANT
DRAPE CAMERA CLOSED 9X96 (DRAPES) IMPLANT
DRSG TEGADERM 2-3/8X2-3/4 SM (GAUZE/BANDAGES/DRESSINGS) ×9 IMPLANT
GAUZE 4X4 16PLY RFD (DISPOSABLE) ×3 IMPLANT
GLOVE BIOGEL PI IND STRL 7.0 (GLOVE) ×12 IMPLANT
GLOVE BIOGEL PI INDICATOR 7.0 (GLOVE) ×6
GLOVE SURG SYN 7.0 (GLOVE) ×18 IMPLANT
GOWN STRL REUS W/ TWL LRG LVL3 (GOWN DISPOSABLE) ×12 IMPLANT
GOWN STRL REUS W/TWL LRG LVL3 (GOWN DISPOSABLE) ×18
GRASPER SUT TROCAR 14GX15 (MISCELLANEOUS) ×3 IMPLANT
IRRIGATION STRYKERFLOW (MISCELLANEOUS) ×2 IMPLANT
IRRIGATOR STRYKERFLOW (MISCELLANEOUS) ×3
IV LACTATED RINGERS 1000ML (IV SOLUTION) ×6 IMPLANT
KIT PINK PAD W/HEAD ARE REST (MISCELLANEOUS) ×3
KIT PINK PAD W/HEAD ARM REST (MISCELLANEOUS) ×2 IMPLANT
KIT TURNOVER CYSTO (KITS) ×3 IMPLANT
MANIFOLD NEPTUNE II (INSTRUMENTS) ×3 IMPLANT
MANIPULATOR VCARE LG CRV RETR (MISCELLANEOUS) ×3 IMPLANT
NS IRRIG 500ML POUR BTL (IV SOLUTION) ×3 IMPLANT
OCCLUDER COLPOPNEUMO (BALLOONS) ×3 IMPLANT
PACK GYN LAPAROSCOPIC (MISCELLANEOUS) ×3 IMPLANT
PAD OB MATERNITY 4.3X12.25 (PERSONAL CARE ITEMS) ×3 IMPLANT
PAD PREP 24X41 OB/GYN DISP (PERSONAL CARE ITEMS) ×3 IMPLANT
PORT ACCESS TROCAR AIRSEAL 12 (TROCAR) ×2 IMPLANT
PORT ACCESS TROCAR AIRSEAL 5 (TROCAR) ×3 IMPLANT
PORT ACCESS TROCAR AIRSEAL 5M (TROCAR) ×1
SCISSORS METZENBAUM CVD 33 (INSTRUMENTS) ×3 IMPLANT
SET CYSTO W/LG BORE CLAMP LF (SET/KITS/TRAYS/PACK) ×3 IMPLANT
SET TRI-LUMEN FLTR TB AIRSEAL (TUBING) ×3 IMPLANT
SHEARS HARMONIC ACE PLUS 36CM (ENDOMECHANICALS) ×3 IMPLANT
SLEEVE ENDOPATH XCEL 5M (ENDOMECHANICALS) IMPLANT
SPONGE GAUZE 2X2 8PLY STRL LF (GAUZE/BANDAGES/DRESSINGS) ×9 IMPLANT
SURGILUBE 2OZ TUBE FLIPTOP (MISCELLANEOUS) ×3 IMPLANT
SUT ENDO VLOC 180-0-8IN (SUTURE) ×3 IMPLANT
SUT MNCRL 4-0 (SUTURE) ×3
SUT MNCRL 4-0 27XMFL (SUTURE) ×2
SUT VIC AB 0 CT1 36 (SUTURE) ×3 IMPLANT
SUTURE MNCRL 4-0 27XMF (SUTURE) ×2 IMPLANT
SYR 10ML LL (SYRINGE) ×3 IMPLANT
SYR 50ML LL SCALE MARK (SYRINGE) ×3 IMPLANT
TROCAR PORT AIRSEAL 8X100 (TROCAR) ×3 IMPLANT
TROCAR XCEL NON-BLD 5MMX100MML (ENDOMECHANICALS) ×6 IMPLANT

## 2020-03-13 NOTE — Interval H&P Note (Signed)
History and Physical Interval Note:  03/13/2020 7:13 AM  Krista Garza  has presented today for surgery, with the diagnosis of Abnormal uterine bleeding.  The various methods of treatment have been discussed with the patient and family. After consideration of risks, benefits and other options for treatment, the patient has consented to  Procedure(s): TOTAL LAPAROSCOPIC HYSTERECTOMY WITH BILATERAL SALPINGECTOMY (Bilateral) CYSTOSCOPY (N/A) as a surgical intervention.  The patient's history has been reviewed, patient examined, no change in status, stable for surgery.  I have reviewed the patient's chart and labs.  Questions were answered to the patient's satisfaction.     [place]

## 2020-03-13 NOTE — Transfer of Care (Signed)
Immediate Anesthesia Transfer of Care Note  Patient: MISHAL PROBERT  Procedure(s) Performed: TOTAL LAPAROSCOPIC HYSTERECTOMY WITH BILATERAL SALPINGECTOMY (Bilateral ) CYSTOSCOPY (N/A )  Patient Location: PACU  Anesthesia Type:General  Level of Consciousness: drowsy and patient cooperative  Airway & Oxygen Therapy: Patient Spontanous Breathing and Patient connected to face mask oxygen  Post-op Assessment: Report given to RN and Post -op Vital signs reviewed and stable  Post vital signs: Reviewed and stable  Last Vitals:  Vitals Value Taken Time  BP 118/70 03/13/20 1021  Temp 36.1 C 03/13/20 1021  Pulse 71 03/13/20 1026  Resp 15 03/13/20 1026  SpO2 100 % 03/13/20 1026  Vitals shown include unvalidated device data.  Last Pain:  Vitals:   03/13/20 1021  TempSrc:   PainSc: Asleep         Complications: No complications documented.

## 2020-03-13 NOTE — Anesthesia Postprocedure Evaluation (Signed)
Anesthesia Post Note  Patient: Krista Garza  Procedure(s) Performed: TOTAL LAPAROSCOPIC HYSTERECTOMY WITH BILATERAL SALPINGECTOMY (Bilateral ) CYSTOSCOPY (N/A )  Patient location during evaluation: PACU Anesthesia Type: General Level of consciousness: awake and alert Pain management: pain level controlled Vital Signs Assessment: post-procedure vital signs reviewed and stable Respiratory status: spontaneous breathing, nonlabored ventilation, respiratory function stable and patient connected to nasal cannula oxygen Cardiovascular status: blood pressure returned to baseline and stable Postop Assessment: no apparent nausea or vomiting Anesthetic complications: no   No complications documented.   Last Vitals:  Vitals:   03/13/20 1344 03/13/20 1446  BP: (!) 144/86 132/80  Pulse: 83 85  Resp: 16 18  Temp:    SpO2: 100% 100%    Last Pain:  Vitals:   03/13/20 1446  TempSrc:   PainSc: 3                  Martha Clan

## 2020-03-13 NOTE — Progress Notes (Signed)
°   03/13/20 0745  Clinical Encounter Type  Visited With Patient;Family  Visit Type Initial  Referral From Chaplain  Consult/Referral To Chaplain  While rounding SDS, Pt waved at chaplain, but chaplain did not recognize her, but later realized who it was. While in [facility] waiting area, chaplain spoke to Pt's mother to find out how she was doing. She said she was fine and did not have any questions. Chaplain told Pt's mother to tell Pt she sends her regards.

## 2020-03-13 NOTE — Anesthesia Procedure Notes (Signed)
Procedure Name: Intubation Performed by: Fletcher-Harrison, Camela Wich, CRNA Pre-anesthesia Checklist: Patient identified, Emergency Drugs available, Suction available and Patient being monitored Patient Re-evaluated:Patient Re-evaluated prior to induction Oxygen Delivery Method: Circle system utilized Preoxygenation: Pre-oxygenation with 100% oxygen Induction Type: IV induction Ventilation: Mask ventilation without difficulty Laryngoscope Size: McGraph and 3 Grade View: Grade I Tube type: Oral Number of attempts: 1 Airway Equipment and Method: Stylet and Oral airway Placement Confirmation: ETT inserted through vocal cords under direct vision,  positive ETCO2,  breath sounds checked- equal and bilateral and CO2 detector Secured at: 21 cm Tube secured with: Tape Dental Injury: Teeth and Oropharynx as per pre-operative assessment        

## 2020-03-13 NOTE — Anesthesia Preprocedure Evaluation (Signed)
Anesthesia Evaluation  Patient identified by MRN, date of birth, ID band Patient awake    Reviewed: Allergy & Precautions, H&P , NPO status , Patient's Chart, lab work & pertinent test results  History of Anesthesia Complications Negative for: history of anesthetic complications  Airway Mallampati: I  TM Distance: >3 FB Neck ROM: full    Dental  (+) Chipped, Dental Advidsory Given, Teeth Intact   Pulmonary neg pulmonary ROS, neg shortness of breath, neg recent URI,           Cardiovascular Exercise Tolerance: Good (-) angina(-) Past MI and (-) DOE negative cardio ROS       Neuro/Psych negative neurological ROS  negative psych ROS   GI/Hepatic negative GI ROS, Neg liver ROS, neg GERD  ,  Endo/Other  negative endocrine ROS  Renal/GU      Musculoskeletal   Abdominal   Peds  Hematology negative hematology ROS (+)   Anesthesia Other Findings Past Medical History: No date: Anemia     Comment:  IRON DEFICIENCY. REQUIRED FERAHEME INFUSIONS. No date: Chest pain, unspecified  Past Surgical History: No date: CESAREAN SECTION     Comment:  x2 No date: DILATION AND CURETTAGE OF UTERUS  BMI    Body Mass Index: 31.04 kg/m      Reproductive/Obstetrics negative OB ROS                             Anesthesia Physical  Anesthesia Plan  ASA: II  Anesthesia Plan: General   Post-op Pain Management:    Induction: Intravenous  PONV Risk Score and Plan: Dexamethasone, Ondansetron, Midazolam and Treatment may vary due to age or medical condition  Airway Management Planned: Oral ETT  Additional Equipment:   Intra-op Plan:   Post-operative Plan: Extubation in OR  Informed Consent: I have reviewed the patients History and Physical, chart, labs and discussed the procedure including the risks, benefits and alternatives for the proposed anesthesia with the patient or authorized representative  who has indicated his/her understanding and acceptance.     Dental Advisory Given  Plan Discussed with: Anesthesiologist, CRNA and Surgeon  Anesthesia Plan Comments: (Patient consented for risks of anesthesia including but not limited to:  - adverse reactions to medications - damage to teeth, lips or other oral mucosa - sore throat or hoarseness - Damage to heart, brain, lungs or loss of life  Patient voiced understanding.)        Anesthesia Quick Evaluation

## 2020-03-13 NOTE — Discharge Instructions (Addendum)
Laparoscopically Assisted Vaginal Hysterectomy, Care After This sheet gives you information about how to care for yourself after your procedure. Your health care provider may also give you more specific instructions. If you have problems or questions, contact your health care provider. What can I expect after the procedure? After the procedure, it is common to have:  Soreness and numbness in your incision areas.  Abdominal pain. You will be given pain medicine to control it.  Vaginal bleeding and discharge. You will need to use a sanitary napkin after this procedure.  Sore throat from the breathing tube that was inserted during surgery. Follow these instructions at home: Medicines  Take over-the-counter and prescription medicines only as told by your health care provider.  Do not take aspirin or ibuprofen. These medicines can cause bleeding.  Do not drive or use heavy machinery while taking prescription pain medicine.  Do not drive for 24 hours if you were given a medicine to help you relax (sedative) during the procedure. Incision care   Follow instructions from your health care provider about how to take care of your incisions. Make sure you: ? Wash your hands with soap and water before you change your bandage (dressing). If soap and water are not available, use hand sanitizer. ? Change your dressing as told by your health care provider. ? Leave stitches (sutures), skin glue, or adhesive strips in place. These skin closures may need to stay in place for 2 weeks or longer. If adhesive strip edges start to loosen and curl up, you may trim the loose edges. Do not remove adhesive strips completely unless your health care provider tells you to do that.  Check your incision area every day for signs of infection. Check for: ? Redness, swelling, or pain. ? Fluid or blood. ? Warmth. ? Pus or a bad smell. Activity  Get regular exercise as told by your health care provider. You may be  told to take short walks every day and go farther each time.  Return to your normal activities as told by your health care provider. Ask your health care provider what activities are safe for you.  Do not douche, use tampons, or have sexual intercourse for at least 6 weeks, or until your health care provider gives you permission.  Do not lift anything that is heavier than 10 lb (4.5 kg), or the limit that your health care provider tells you, until he or she says that it is safe. General instructions  Do not take baths, swim, or use a hot tub until your health care provider approves. Take showers instead of baths.  Do not drive for 24 hours if you received a sedative.  Do not drive or operate heavy machinery while taking prescription pain medicine.  To prevent or treat constipation while you are taking prescription pain medicine, your health care provider may recommend that you: ? Drink enough fluid to keep your urine clear or pale yellow. ? Take over-the-counter or prescription medicines. ? Eat foods that are high in fiber, such as fresh fruits and vegetables, whole grains, and beans. ? Limit foods that are high in fat and processed sugars, such as fried and sweet foods.  Keep all follow-up visits as told by your health care provider. This is important. Contact a health care provider if:  You have signs of infection, such as: ? Redness, swelling, or pain around your incision sites. ? Fluid or blood coming from an incision. ? An incision that feels warm to the   touch. ? Pus or a bad smell coming from an incision.  Your incision breaks open.  Your pain medicine is not helping.  You feel dizzy or light-headed.  You have pain or bleeding when you urinate.  You have persistent nausea and vomiting.  You have blood, pus, or a bad-smelling discharge from your vagina. Get help right away if:  You have a fever.  You have severe abdominal pain.  You have chest pain.  You have  shortness of breath.  You faint.  You have pain, swelling, or redness in your leg.  You have heavy bleeding from your vagina. Summary  After the procedure, it is common to have abdominal pain and vaginal bleeding.  You should not drive or lift heavy objects until your health care provider says that it is safe.  Contact your health care provider if you have any symptoms of infection, excessive vaginal bleeding, nausea, vomiting, or shortness of breath. This information is not intended to replace advice given to you by your health care provider. Make sure you discuss any questions you have with your health care provider. Document Revised: 02/20/2017 Document Reviewed: 05/06/2016 Elsevier Patient Education  [place].  Bupivacaine Liposomal Suspension for Injection What is this medicine? BUPIVACAINE LIPOSOMAL (bue PIV a kane LIP oh som al) is an anesthetic. It causes loss of feeling in the skin or other tissues. It is used to prevent and to treat pain from some procedures. This medicine may be used for other purposes; ask your health care provider or pharmacist if you have questions. COMMON BRAND NAME(S): EXPAREL What should I tell my health care provider before I take this medicine? They need to know if you have any of these conditions:  G6PD deficiency  heart disease  kidney disease  liver disease  low blood pressure  lung or breathing disease, like asthma  an unusual or allergic reaction to bupivacaine, other medicines, foods, dyes, or preservatives  pregnant or trying to get pregnant  breast-feeding How should I use this medicine? This medicine is for injection into the affected area. It is given by a health care professional in a hospital or clinic setting. Talk to your pediatrician regarding the use of this medicine in children. Special care may be needed. Overdosage: If you think you have taken too much of this medicine contact a poison control center or  emergency room at once. NOTE: This medicine is only for you. Do not share this medicine with others. What if I miss a dose? This does not apply. What may interact with this medicine? This medicine may interact with the following medications:  acetaminophen  certain antibiotics like dapsone, nitrofurantoin, aminosalicylic acid, sulfonamides  certain medicines for seizures like phenobarbital, phenytoin, valproic acid  chloroquine  cyclophosphamide  flutamide  hydroxyurea  ifosfamide  metoclopramide  nitric oxide  nitroglycerin  nitroprusside  nitrous oxide  other local anesthetics like lidocaine, pramoxine, tetracaine  primaquine  quinine  rasburicase  sulfasalazine This list may not describe all possible interactions. Give your health care provider a list of all the medicines, herbs, non-prescription drugs, or dietary supplements you use. Also tell them if you smoke, drink alcohol, or use illegal drugs. Some items may interact with your medicine. What should I watch for while using this medicine? Your condition will be monitored carefully while you are receiving this medicine. Be careful to avoid injury while the area is numb, and you are not aware of pain. What side effects may I notice from  receiving this medicine? Side effects that you should report to your doctor or health care professional as soon as possible:  allergic reactions like skin rash, itching or hives, swelling of the face, lips, or tongue  seizures  signs and symptoms of a dangerous change in heartbeat or heart rhythm like chest pain; dizziness; fast, irregular heartbeat; palpitations; feeling faint or lightheaded; falls; breathing problems  signs and symptoms of methemoglobinemia such as pale, gray, or blue colored skin; headache; fast heartbeat; shortness of breath; feeling faint or lightheaded, falls; tiredness Side effects that usually do not require medical attention (report to your doctor  or health care professional if they continue or are bothersome):  anxious  back pain  changes in taste  changes in vision  constipation  dizziness  fever  nausea, vomiting This list may not describe all possible side effects. Call your doctor for medical advice about side effects. You may report side effects to FDA at 1-800-FDA-1088. Where should I keep my medicine? This drug is given in a hospital or clinic and will not be stored at home. NOTE: This sheet is a summary. It may not cover all possible information. If you have questions about this medicine, talk to your doctor, pharmacist, or health care provider.  2020 Elsevier/Gold Standard (2018-12-21 10:48:23)     AMBULATORY SURGERY  DISCHARGE INSTRUCTIONS  1) The drugs that you were given will stay in your system until tomorrow so for the next 24 hours you should not: A) Drive an automobile B) Make any legal decisions C) Drink any alcoholic beverage  2) You may resume regular meals tomorrow.  Today it is better to start with liquids and gradually work up to solid foods. You may eat anything you prefer, but it is better to start with liquids, then soup and crackers, and gradually work up to solid foods.  3) Please notify your doctor immediately if you have any unusual bleeding, trouble breathing, redness and pain at the surgery site, drainage, fever, or pain not relieved by medication.    4) Additional Instructions:  Please contact your physician with any problems or Same Day Surgery at [phone], Monday through Friday 6 am to 4 pm, or [place] [facility] number at [phone].

## 2020-03-13 NOTE — Op Note (Addendum)
Operative Report:  PRE-OP DIAGNOSIS: Abnormal uterine bleeding  Menorrhagia with irregular cycle  POST-OP DIAGNOSIS: Abnormal uterine bleeding Menorrhagia with irregular cycle Endometriosis  PROCEDURE: Procedure(s): TOTAL LAPAROSCOPIC HYSTERECTOMY WITH BILATERAL SALPINGECTOMY CYSTOSCOPY  SURGEON: Adrian Prows MD  ASSISTANT: Malachy Mood MD   ANESTHESIA: General endotracheal anesthesia  ESTIMATED BLOOD LOSS: 200 cc  SPECIMENS: Uterus, Tubes.  COMPLICATIONS: None  DISPOSITION: stable to PACU  FINDINGS: Intraabdominal adhesions were not noted. Enlarged uterus, sounded to 13 cm. Powder burn lesion on peritoneum and uterosacrals consistent with endometriosis. Grossly normal ovaries and fallopian tubes. Grossly normal cervix.   PROCEDURE:  . The patient was taken to the OR where anesthesia was administed. She was prepped and draped in the normal sterile fashion in the dorsal lithotomy position in the [place] stirrups. A time out was performed. A Graves speculum was inserted, the cervix was grasped with a single tooth tenaculum and the endometrial cavity was sounded. The cervix was progressively dilated to a size 18 [place] with [employer] dilators. A V-Care uterine manipulator was inserted in the usual fashion without incident. Gloves were changed and attention was turned to the abdomen.    An infraumbilical  transverse 5 mm skin incision was made with the scalpel after local anesthesia applied to the skin. A 5 mm trocar was inserted under direct visualization.  A pneumoperitoneum was obtained by insufflation of CO2 (opening pressure of 17mmHg) to 68mmHg. A diagnostic laparoscopy was performed yielding the previously described findings. Attention was turned to the left lower quadrant where after visualization of the inferior epigastric vessels a 25mm skin incision was made with the scalpel. A 5 mm laparoscopic port was inserted. Attention was turned to the right lower quadrant where after  visualization of the inferior epigastric vessels a 8 mm skin incision was made with the scalpel. A 5 mm laparoscopic Airseal port was inserted.    . Attention was then turned to the left fallopian tube which was recognized by visualization of the fimbria. The tube is excised to its attachment to the uterus. Attention was turned to the left aspect of the uterus, where after visualization of the ureter, the round ligament was coagulated and transected using the 53mm Harmonic Scapel. The anterior and posterior leafs of the broad ligament were dissected off as the anterior one was coagulated and transected in a caudal direction towards the cuff of the uterine manipulator.   The uterine-ovarian ligament and its blood vessels were carefully coagulated and transected using the Harmonic scapel.  Attention was turned to the right aspect of the uterus where the same procedure was performed.  The vesicouterine reflection of the peritoneum was dissected with the harmonic scapel and the bladder flap was created bluntly.  The uterine vessels were coagulated and transected bilaterally using first bipolar cautery and then the harmonic scapel. A 360 degree, circumferential colpotomy was done to completely amputate the uterus with cervix and tubes. Once the specimen was amputated it was delivered through the vagina.   . The 5 mm laparoscopic Airseal port was exchanged for an 8 mm Airseal port. The colpotomy was repaired in a simple running fashion using a delayed absorbable suture with an endo-stitch device.  Vaginal exam confirmed complete closure.  The cavity was copiously irrigated. A survey of the pelvic cavity revealed adequate hemostasis and no injury to bowel, bladder, or ureter.   . A diagnostic cystoscopy was performed using saline distension of bladder with no lesions or injuries noted.  Bilateral urine flow from each ureteral orifice  was visualized.  . At this point the procedure was finalized. Right fascia incision  was closed with a vicryl suture using the fascia closure device. All the instruments were removed from the patient's body. Gas was expelled and patient is leveled.  Incisions are closed with 4-0 monocryl and skin adhesive.    . Patient was taken to the recovery room in stable condition.  All sponge, instrument, and needle counts are correct x2.    Dr. Georgianne Fick assisted with this case. This was a high level case requiring a Physicist, medical. No other assistant was readily available. Dr. Georgianne Fick assisted with dissection and resection of tissue and manipulation of the uterus during the case.    Adrian Prows MD [facility] 03/13/2020 10:11 AM

## 2020-03-14 ENCOUNTER — Encounter: Payer: Self-pay | Admitting: Obstetrics and Gynecology

## 2020-03-15 LAB — SURGICAL PATHOLOGY

## 2020-03-18 NOTE — Progress Notes (Signed)
[place]  [phone] [phone]  ID: [name] OB: [birth year]  MR#: [id number]  ELF#:[id number]  Patient Care Team: Delsa Grana, PA-C as PCP - General (Family Medicine) Lloyd Huger, MD as Consulting Physician (Oncology)  CHIEF COMPLAINT: Iron deficiency anemia.  INTERVAL HISTORY: Patient returns to clinic today for repeat laboratory work, further evaluation, and consideration of addition IV Feraheme.  She recent underwent hysterectomy. She has increased weakness and fatigue, but otherwise feels well. She has no neurologic complaints.  She denies any recent fevers or illnesses.  She has a good appetite and denies weight loss.  She has no chest pain, shortness of breath, cough, or hemoptysis.  She denies any nausea, vomiting, constipation, or diarrhea.  She has no melena or hematochezia.  She has no urinary complaints. Patient offers no further specific complaints today.  REVIEW OF SYSTEMS:   Review of Systems  Constitutional: Positive for malaise/fatigue. Negative for fever and weight loss.  Respiratory: Negative.  Negative for cough, hemoptysis and shortness of breath.   Cardiovascular: Negative.  Negative for chest pain and leg swelling.  Gastrointestinal: Negative.  Negative for abdominal pain, blood in stool and melena.  Genitourinary: Negative.  Negative for hematuria.  Musculoskeletal: Negative.  Negative for back pain.  Skin: Negative.  Negative for rash.  Neurological: Positive for weakness. Negative for dizziness, focal weakness and headaches.  Psychiatric/Behavioral: Negative.  The patient is not nervous/anxious.     As per HPI. Otherwise, a complete review of systems is negative.  PAST MEDICAL HISTORY: Past Medical History:  Diagnosis Date  . Anemia    IRON DEFICIENCY. REQUIRED FERAHEME INFUSIONS.  Marland Kitchen Chest pain, unspecified     PAST SURGICAL HISTORY: Past Surgical History:  Procedure Laterality Date  . CESAREAN  SECTION     x2  . CYSTOSCOPY N/A 03/13/2020   Procedure: CYSTOSCOPY;  Surgeon: Homero Fellers, MD;  Location: [facility];  Service: Gynecology;  Laterality: N/A;  . DILATION AND CURETTAGE OF UTERUS    . HYSTEROSCOPY WITH D & C N/A 06/21/2019   Procedure: DILATATION AND CURETTAGE /HYSTEROSCOPY, Polypectomy with myosure;  Surgeon: Homero Fellers, MD;  Location: [facility] OR[facility];  Service: Gynecology;  Laterality: N/A;  . TOTAL LAPAROSCOPIC HYSTERECTOMY WITH SALPINGECTOMY Bilateral 03/13/2020   Procedure: TOTAL LAPAROSCOPIC HYSTERECTOMY WITH BILATERAL SALPINGECTOMY;  Surgeon: Homero Fellers, MD;  Location: [facility];  Service: Gynecology;  Laterality: Bilateral;    FAMILY HISTORY: Family History  Problem Relation Age of Onset  . Diabetes Father   . Hyperlipidemia Father   . Hypertension Father   . Cancer Father 46       colon  . Colon cancer Father     ADVANCED DIRECTIVES (Y/N):  N  HEALTH MAINTENANCE: Social History   Tobacco Use  . Smoking status: Never Smoker  . Smokeless tobacco: Never Used  Vaping Use  . Vaping Use: Never used  Substance Use Topics  . Alcohol use: Yes    Comment: margaritas occasionally  . Drug use: No     Colonoscopy:  PAP:  Bone density:  Lipid panel:  Allergies  Allergen Reactions  . Adhesive [Tape] Rash    Paper tape okay.  bandaids are a problem.    Current Outpatient Medications  Medication Sig Dispense Refill  . acetaminophen (TYLENOL) 500 MG tablet Take 2 tablets (1,000 mg total) by mouth every 6 (six) hours as needed. 100 tablet 0  . ibuprofen (ADVIL) 600 MG tablet Take 1 tablet (600 mg  total) by mouth every 6 (six) hours as needed. 60 tablet 0  . oxyCODONE (ROXICODONE) 5 MG immediate release tablet Take 1 tablet (5 mg total) by mouth every 8 (eight) hours as needed. 20 tablet 0  . B Complex-C (VITAMIN B + C COMPLEX) TABS Take 1 tablet by mouth daily. With zinc (Patient not taking: Reported on 03/20/2020)    . ibuprofen  (ADVIL) 600 MG tablet Take 600 mg by mouth every 6 (six) hours as needed.    . Multiple Vitamins-Minerals (WOMENS ONE DAILY PO) Take 1 tablet by mouth daily. Gummies (Patient not taking: Reported on 03/20/2020)     No current facility-administered medications for this visit.   Facility-Administered Medications Ordered in Other Visits  Medication Dose Route Frequency Provider Last Rate Last Admin  . ferumoxytol (FERAHEME) 510 mg in sodium chloride 0.9 % 100 mL IVPB  510 mg Intravenous Once Lloyd Huger, MD 468 mL/hr at 03/20/20 1438 510 mg at 03/20/20 1438    OBJECTIVE: Vitals:   03/20/20 1340  BP: 130/80  Pulse: 73  Temp: 98 F (36.7 C)  SpO2: 100%     Body mass index is 33.84 kg/m.    ECOG FS:0 - Asymptomatic  General: Well-developed, well-nourished, no acute distress. Eyes: Pink conjunctiva, anicteric sclera. HEENT: Normocephalic, moist mucous membranes. Lungs: No audible wheezing or coughing. Heart: Regular rate and rhythm. Abdomen: Soft, nontender, no obvious distention. Musculoskeletal: No edema, cyanosis, or clubbing. Neuro: Alert, answering all questions appropriately. Cranial nerves grossly intact. Skin: No rashes or petechiae noted. Psych: Normal affect.   LAB RESULTS:  Lab Results  Component Value Date   NA 137 03/02/2019   K 4.2 03/02/2019   CL 104 03/02/2019   CO2 26 03/02/2019   GLUCOSE 83 03/02/2019   BUN 11 03/02/2019   CREATININE 0.70 03/02/2019   CALCIUM 9.4 03/02/2019   PROT 7.3 03/02/2019   AST 15 03/02/2019   ALT 8 03/02/2019   BILITOT 0.3 03/02/2019   GFRNONAA 105 03/02/2019   GFRAA 121 03/02/2019    Lab Results  Component Value Date   WBC 9.0 03/19/2020   NEUTROABS 6.2 03/19/2020   HGB 9.7 (L) 03/19/2020   HCT 33.2 (L) 03/19/2020   MCV 72.2 (L) 03/19/2020   PLT 448 (H) 03/19/2020   Lab Results  Component Value Date   IRON 14 (L) 03/19/2020   TIBC 414 03/19/2020   IRONPCTSAT 3 (L) 03/19/2020   Lab Results  Component  Value Date   FERRITIN 3 (L) 03/19/2020     STUDIES: No results found.  ASSESSMENT: Iron deficiency anemia secondary to chronic blood loss.  PLAN:    1.  Iron deficiency anemia: Patients hemoglobin and iron stores have decreased despite recent hysterecotmy and she is mildly symptomatic. She has been instructed to continue oral iron supplementation and will proceed with 510 mg IV Feraheme today. Return to clinic in 1 week for a second infusion.  Patient will then return to clinic in 2 months for repeat laboratory work, further evaluation, and consideration of additional Feraheme.   2.  Heavy menses: Resolved with hysterectomy. 3.  Thrombocytosis: Secondary to iron deficiency.  Feraheme as above.    Patient expressed understanding and was in agreement with this plan. She also understands that She can call clinic at any time with any questions, concerns, or complaints.    Lloyd Huger, MD   03/20/2020 2:50 PM

## 2020-03-19 ENCOUNTER — Other Ambulatory Visit: Payer: Self-pay

## 2020-03-19 ENCOUNTER — Inpatient Hospital Stay: Payer: No Typology Code available for payment source | Attending: Oncology

## 2020-03-19 DIAGNOSIS — Z800 Family history of malignant neoplasm of digestive organs: Secondary | ICD-10-CM | POA: Insufficient documentation

## 2020-03-19 DIAGNOSIS — N920 Excessive and frequent menstruation with regular cycle: Secondary | ICD-10-CM | POA: Diagnosis not present

## 2020-03-19 DIAGNOSIS — D500 Iron deficiency anemia secondary to blood loss (chronic): Secondary | ICD-10-CM | POA: Diagnosis not present

## 2020-03-19 LAB — IRON AND TIBC
Iron: 14 ug/dL — ABNORMAL LOW (ref 28–170)
Saturation Ratios: 3 % — ABNORMAL LOW (ref 10.4–31.8)
TIBC: 414 ug/dL (ref 250–450)
UIBC: 400 ug/dL

## 2020-03-19 LAB — CBC WITH DIFFERENTIAL/PLATELET
Abs Immature Granulocytes: 0.03 10*3/uL (ref 0.00–0.07)
Basophils Absolute: 0.1 10*3/uL (ref 0.0–0.1)
Basophils Relative: 1 %
Eosinophils Absolute: 0.3 10*3/uL (ref 0.0–0.5)
Eosinophils Relative: 3 %
HCT: 33.2 % — ABNORMAL LOW (ref 36.0–46.0)
Hemoglobin: 9.7 g/dL — ABNORMAL LOW (ref 12.0–15.0)
Immature Granulocytes: 0 %
Lymphocytes Relative: 19 %
Lymphs Abs: 1.7 10*3/uL (ref 0.7–4.0)
MCH: 21.1 pg — ABNORMAL LOW (ref 26.0–34.0)
MCHC: 29.2 g/dL — ABNORMAL LOW (ref 30.0–36.0)
MCV: 72.2 fL — ABNORMAL LOW (ref 80.0–100.0)
Monocytes Absolute: 0.7 10*3/uL (ref 0.1–1.0)
Monocytes Relative: 8 %
Neutro Abs: 6.2 10*3/uL (ref 1.7–7.7)
Neutrophils Relative %: 69 %
Platelets: 448 10*3/uL — ABNORMAL HIGH (ref 150–400)
RBC: 4.6 MIL/uL (ref 3.87–5.11)
RDW: 15.9 % — ABNORMAL HIGH (ref 11.5–15.5)
WBC: 9 10*3/uL (ref 4.0–10.5)
nRBC: 0 % (ref 0.0–0.2)

## 2020-03-19 LAB — FERRITIN: Ferritin: 3 ng/mL — ABNORMAL LOW (ref 11–307)

## 2020-03-20 ENCOUNTER — Inpatient Hospital Stay (HOSPITAL_BASED_OUTPATIENT_CLINIC_OR_DEPARTMENT_OTHER): Payer: No Typology Code available for payment source | Admitting: Oncology

## 2020-03-20 ENCOUNTER — Encounter: Payer: Self-pay | Admitting: Obstetrics and Gynecology

## 2020-03-20 ENCOUNTER — Inpatient Hospital Stay: Payer: No Typology Code available for payment source

## 2020-03-20 ENCOUNTER — Ambulatory Visit (INDEPENDENT_AMBULATORY_CARE_PROVIDER_SITE_OTHER): Payer: No Typology Code available for payment source | Admitting: Obstetrics and Gynecology

## 2020-03-20 ENCOUNTER — Other Ambulatory Visit: Payer: Self-pay

## 2020-03-20 ENCOUNTER — Encounter: Payer: Self-pay | Admitting: Oncology

## 2020-03-20 VITALS — BP 110/75 | Resp 18

## 2020-03-20 VITALS — BP 130/74 | Ht 72.0 in | Wt 249.0 lb

## 2020-03-20 VITALS — BP 130/80 | HR 73 | Temp 98.0°F | Wt 249.5 lb

## 2020-03-20 DIAGNOSIS — D500 Iron deficiency anemia secondary to blood loss (chronic): Secondary | ICD-10-CM

## 2020-03-20 DIAGNOSIS — Z90710 Acquired absence of both cervix and uterus: Secondary | ICD-10-CM

## 2020-03-20 MED ORDER — SODIUM CHLORIDE 0.9 % IV SOLN
Freq: Once | INTRAVENOUS | Status: AC
  Filled 2020-03-20: qty 250

## 2020-03-20 MED ORDER — SODIUM CHLORIDE 0.9 % IV SOLN
510.0000 mg | Freq: Once | INTRAVENOUS | Status: AC
  Administered 2020-03-20: 15:00:00 510 mg via INTRAVENOUS
  Filled 2020-03-20: qty 510

## 2020-03-20 NOTE — Progress Notes (Signed)
Post op

## 2020-03-20 NOTE — Progress Notes (Signed)
Pt tolerated feraheme infusion well with no complications. VSS. Pt stable for discharge.   Krista Garza [employer]

## 2020-03-20 NOTE — Progress Notes (Signed)
  Postoperative Follow-up Patient presents post op from laparoscopic assisted vaginal hysterectomy for abnormal uterine bleeding, 1 week ago.  Subjective: Patient reports some improvement in her preop symptoms. Eating a regular diet without difficulty. Pain is controlled with current analgesics. Medications being used: acetaminophen and ibuprofen (OTC).  Activity: normal activities of daily living. Patient reports additional symptom's since surgery of None.  Objective: BP 130/74   Ht 6' (1.829 m)   Wt 249 lb (112.9 kg)   BMI 33.77 kg/m  Physical Exam Constitutional:      Appearance: Normal appearance.  Cardiovascular:     Rate and Rhythm: Normal rate and regular rhythm.  Pulmonary:     Effort: Pulmonary effort is normal.  Abdominal:     General: Abdomen is flat.     Palpations: Abdomen is soft.     Comments: Incisions are clean, dry, intact  Neurological:     Mental Status: She is alert.    Assessment: s/p :  total laparoscopic hysterectomy with bilateral salpingectomy stable  Plan: Patient has done well after surgery with no apparent complications.  I have discussed the post-operative course to date, and the expected progress moving forward.  The patient understands what complications to be concerned about.  I will see the patient in routine follow up, or sooner if needed.    Activity plan: Avoid heavy lifting.  Pelvic rest.  Bryn Perkin R Mateus Rewerts 03/20/2020, 8:21 AM

## 2020-03-27 ENCOUNTER — Other Ambulatory Visit: Payer: Self-pay

## 2020-03-27 ENCOUNTER — Inpatient Hospital Stay: Payer: No Typology Code available for payment source | Attending: Oncology

## 2020-03-27 VITALS — BP 117/85 | HR 68 | Temp 97.6°F | Resp 16

## 2020-03-27 DIAGNOSIS — D500 Iron deficiency anemia secondary to blood loss (chronic): Secondary | ICD-10-CM | POA: Diagnosis not present

## 2020-03-27 DIAGNOSIS — Z800 Family history of malignant neoplasm of digestive organs: Secondary | ICD-10-CM | POA: Insufficient documentation

## 2020-03-27 DIAGNOSIS — N920 Excessive and frequent menstruation with regular cycle: Secondary | ICD-10-CM | POA: Diagnosis present

## 2020-03-27 MED ORDER — SODIUM CHLORIDE 0.9 % IV SOLN
510.0000 mg | Freq: Once | INTRAVENOUS | Status: AC
  Administered 2020-03-27: 510 mg via INTRAVENOUS
  Filled 2020-03-27: qty 510

## 2020-03-27 NOTE — Progress Notes (Signed)
Feraheme well tolerated. Discharged home in stable condition. 

## 2020-04-03 ENCOUNTER — Ambulatory Visit: Payer: No Typology Code available for payment source | Admitting: Obstetrics and Gynecology

## 2020-04-04 ENCOUNTER — Ambulatory Visit (INDEPENDENT_AMBULATORY_CARE_PROVIDER_SITE_OTHER): Payer: No Typology Code available for payment source | Admitting: Obstetrics and Gynecology

## 2020-04-04 ENCOUNTER — Encounter: Payer: Self-pay | Admitting: Obstetrics and Gynecology

## 2020-04-04 ENCOUNTER — Other Ambulatory Visit: Payer: Self-pay

## 2020-04-04 VITALS — BP 132/70 | Ht 74.0 in | Wt 246.8 lb

## 2020-04-04 DIAGNOSIS — Z90710 Acquired absence of both cervix and uterus: Secondary | ICD-10-CM

## 2020-04-04 NOTE — Progress Notes (Signed)
Post OP 

## 2020-04-04 NOTE — Progress Notes (Signed)
  Postoperative Follow-up Patient presents post op from laparoscopic assisted vaginal hysterectomy for abnormal uterine bleeding, 2 weeks ago.  Subjective: Patient reports marked improvement in her preop symptoms. Eating a regular diet without difficulty. The patient is not having any pain.  Activity: normal activities of daily living. Patient reports additional symptom's since surgery of None.  Objective: BP 132/70   Ht 6\' 2"  (1.88 m)   Wt 246 lb 12.8 oz (111.9 kg)   LMP 04/18/2019 (Approximate) Comment: 9 weeks ongoing menses  BMI 31.69 kg/m  Physical Exam Constitutional:      Appearance: Normal appearance.  HENT:     Head: Normocephalic and atraumatic.  Eyes:     Pupils: Pupils are equal, round, and reactive to light.  Cardiovascular:     Rate and Rhythm: Normal rate.  Pulmonary:     Effort: Pulmonary effort is normal.     Breath sounds: Normal breath sounds.  Abdominal:     General: Abdomen is flat.     Palpations: Abdomen is soft.     Comments: Incisions are clean, dry, intact. Suture removed from right LQ incision  Neurological:     Mental Status: She is alert and oriented to person, place, and time. Mental status is at baseline.  Skin:    General: Skin is warm and dry.  Psychiatric:        Mood and Affect: Mood normal.        Behavior: Behavior normal.        Thought Content: Thought content normal.     Assessment: s/p :  total laparoscopic hysterectomy with bilateral salpingectomy stable  Plan: Patient has done well after surgery with no apparent complications.  I have discussed the post-operative course to date, and the expected progress moving forward.  The patient understands what complications to be concerned about.  I will see the patient in routine follow up, or sooner if needed.    Activity plan: No heavy lifting. Pelvic rest.  Rey Dansby R Ora Bollig 04/04/2020, 8:55 AM

## 2020-05-11 ENCOUNTER — Other Ambulatory Visit: Payer: Self-pay | Admitting: Obstetrics and Gynecology

## 2020-05-11 ENCOUNTER — Ambulatory Visit (INDEPENDENT_AMBULATORY_CARE_PROVIDER_SITE_OTHER): Payer: No Typology Code available for payment source | Admitting: Obstetrics and Gynecology

## 2020-05-11 ENCOUNTER — Other Ambulatory Visit: Payer: Self-pay

## 2020-05-11 ENCOUNTER — Encounter: Payer: Self-pay | Admitting: Obstetrics and Gynecology

## 2020-05-11 VITALS — BP 120/74 | Ht 74.5 in | Wt 246.8 lb

## 2020-05-11 DIAGNOSIS — N760 Acute vaginitis: Secondary | ICD-10-CM

## 2020-05-11 MED ORDER — METRONIDAZOLE 500 MG PO TABS
500.0000 mg | ORAL_TABLET | Freq: Two times a day (BID) | ORAL | 0 refills | Status: DC
Start: 2020-05-11 — End: 2020-05-11

## 2020-05-11 NOTE — Patient Instructions (Signed)
General vulvovaginal hygiene measures Keep vulva clean, dry, and well aerated  0 Avoid sleeper pajamas. Nightgowns allow air to circulate.  0 Cotton underpants. Double-rinse underwear after washing to avoid residual irritants. Do not use fabric softeners for underwear and swimsuits.  0 Avoid tights, leotards, and leggings. Skirts and loose-fitting pants allow air to circulate.  0 Avoid sitting in wet swimsuits for long periods of time.  Daily warm bathing   0 Do not use bubble baths or perfumed soaps.  0 soak in clean water (no soap) for 10 to 15 minutes.  0 Use soap to wash regions other than the genital area just before getting out of the tub. Limit use of any soap on genital areas.  0 Rinse the genital area well and gently pat dry.  0 A hair dryer on the cool setting may be helpful to assist with drying the genital region.  0 If the vulvar area is tender or swollen, cool compresses may relieve the discomfort. Emollients may help protect skin.  Review toilet hygiene    1 Sit with knees apart to reduce reflux of urine into the vagina.    0 Emphasize wiping front to back after bowel movements.  0 Wet wipes can be used instead of toilet paper for wiping as long as they don't cause a "stinging" sensation.    Bacterial Vaginosis  Bacterial vaginosis is an infection that occurs when the normal balance of bacteria in the vagina changes. This change is caused by an overgrowth of certain bacteria in the vagina. Bacterial vaginosis is the most common vaginal infection among females aged 82 to 66 years. This condition increases the risk of sexually transmitted infections (STIs). Treatment can help reduce this risk. Treatment is very important for pregnant women because this condition can cause babies to be born early (prematurely) or at a low birth weight. What are the causes? This condition is caused by an increase in harmful bacteria that are normally present in small amounts in the vagina.  However, the exact reason this condition develops is not known. You cannot get bacterial vaginosis from toilet seats, bedding, swimming pools, or contact with objects around you. What increases the risk? The following factors may make you more likely to develop this condition:  Having a new sexual partner or multiple sexual partners, or having unprotected sex.  Douching.  Having an intrauterine device (IUD).  Smoking.  Abusing drugs and alcohol. This may lead to riskier sexual behavior.  Taking certain antibiotic medicines.  Being pregnant. What are the signs or symptoms? Some women with this condition have no symptoms. Symptoms may include:  Pearline Cables or white vaginal discharge. The discharge can be watery or foamy.  A fish-like odor with discharge, especially after sex or during menstruation.  Itching in and around the vagina.  Burning or pain with urination. How is this diagnosed? This condition is diagnosed based on:  Your medical history.  A physical exam of the vagina.  Checking a sample of vaginal fluid for harmful bacteria or abnormal cells. How is this treated? This condition is treated with antibiotic medicines. These may be given as a pill, a vaginal cream, or a medicine that is put into the vagina (suppository). If the condition comes back after treatment, a second round of antibiotics may be needed. Follow these instructions at home: Medicines  Take or apply over-the-counter and prescription medicines only as told by your health care provider.  Take or apply your antibiotic medicine as told by your  health care provider. Do not stop using the antibiotic even if you start to feel better. General instructions  If you have a female sexual partner, tell her that you have a vaginal infection. She should follow up with her health care provider. If you have a female sexual partner, he does not need treatment.  Avoid sexual activity until you finish treatment.  Drink  enough fluid to keep your urine pale yellow.  Keep the area around your vagina and rectum clean. ? Wash the area daily with warm water. ? Wipe yourself from front to back after using the toilet.  If you are breastfeeding, talk to your health care provider about continuing breastfeeding during treatment.  Keep all follow-up visits. This is important. How is this prevented? Self-care  Do not douche.  Wash the outside of your vagina with warm water only.  Wear cotton or cotton-lined underwear.  Avoid wearing tight pants and pantyhose, especially during the summer. Safe sex  Use protection when having sex. This includes: ? Using condoms. ? Using dental dams. This is a thin layer of a material made of latex or polyurethane that protects the mouth during oral sex.  Limit the number of sexual partners. To help prevent bacterial vaginosis, it is best to have sex with just one partner (monogamous relationship).  Make sure you and your sexual partner are tested for STIs. Drugs and alcohol  Do not use any products that contain nicotine or tobacco. These products include cigarettes, chewing tobacco, and vaping devices, such as e-cigarettes. If you need help quitting, ask your health care provider.  Do not use drugs.  Do not drink alcohol if: ? Your health care provider tells you not to do this. ? You are pregnant, may be pregnant, or are planning to become pregnant.  If you drink alcohol: ? Limit how much you have to 0-1 drink a day. ? Be aware of how much alcohol is in your drink. In the [place] one drink equals one 12 oz bottle of beer (355 mL), one 5 oz glass of wine (148 mL), or one 1 oz glass of hard liquor (44 mL). Where to find more information  Centers for Disease Control and Prevention: http://www.wolf.info/  American Sexual Health Association (ASHA): www.ashastd.org  [place] Department of Health and Financial controller, Office on Women's Health: [place].tn Contact a health care  provider if:  Your symptoms do not improve, even after treatment.  You have more discharge or pain when urinating.  You have a fever or chills.  You have pain in your abdomen or pelvis.  You have pain during sex.  You have vaginal bleeding between menstrual periods. Summary  Bacterial vaginosis is a vaginal infection that occurs when the normal balance of bacteria in the vagina changes. It results from an overgrowth of certain bacteria.  This condition increases the risk of sexually transmitted infections (STIs). Getting treated can help reduce this risk.  Treatment is very important for pregnant women because this condition can cause babies to be born early (prematurely) or at low birth weight.  This condition is treated with antibiotic medicines. These may be given as a pill, a vaginal cream, or a medicine that is put into the vagina (suppository). This information is not intended to replace advice given to you by your health care provider. Make sure you discuss any questions you have with your health care provider. Document Revised: 09/08/2019 Document Reviewed: 09/08/2019 Elsevier Patient Education  [place].

## 2020-05-11 NOTE — Progress Notes (Signed)
Patient ID: Krista Garza, female   DOB: 23-Jun-1975, 45 y.o.   MRN: [id number]  Reason for Consult: Post-op Follow-up   Referred by Delsa Grana, PA-C  Subjective:     HPI:  Krista Garza is a 45 y.o. female . She reports she has had a vaginal odor for several weeks. She has occasional pink spotting generally when she "over does it. "   Past Medical History:  Diagnosis Date  . Anemia    IRON DEFICIENCY. REQUIRED FERAHEME INFUSIONS.  Marland Kitchen Chest pain, unspecified    Family History  Problem Relation Age of Onset  . Diabetes Father   . Hyperlipidemia Father   . Hypertension Father   . Cancer Father 71       colon  . Colon cancer Father    Past Surgical History:  Procedure Laterality Date  . CESAREAN SECTION     x2  . CYSTOSCOPY N/A 03/13/2020   Procedure: CYSTOSCOPY;  Surgeon: Homero Fellers, MD;  Location: [facility];  Service: Gynecology;  Laterality: N/A;  . DILATION AND CURETTAGE OF UTERUS    . HYSTEROSCOPY WITH D & C N/A 06/21/2019   Procedure: DILATATION AND CURETTAGE /HYSTEROSCOPY, Polypectomy with myosure;  Surgeon: Homero Fellers, MD;  Location: [facility];  Service: Gynecology;  Laterality: N/A;  . TOTAL LAPAROSCOPIC HYSTERECTOMY WITH SALPINGECTOMY Bilateral 03/13/2020   Procedure: TOTAL LAPAROSCOPIC HYSTERECTOMY WITH BILATERAL SALPINGECTOMY;  Surgeon: Homero Fellers, MD;  Location: [facility];  Service: Gynecology;  Laterality: Bilateral;    Short Social History:  Social History   Tobacco Use  . Smoking status: Never Smoker  . Smokeless tobacco: Never Used  Substance Use Topics  . Alcohol use: Yes    Comment: margaritas occasionally    Allergies  Allergen Reactions  . Adhesive [Tape] Rash    Paper tape okay.  bandaids are a problem.    Current Outpatient Medications  Medication Sig Dispense Refill  . metroNIDAZOLE (FLAGYL) 500 MG tablet Take 1 tablet (500 mg total) by mouth 2 (two) times daily for 7 days. 14 tablet 0  . B Complex-C  (VITAMIN B + C COMPLEX) TABS Take 1 tablet by mouth daily. With zinc (Patient not taking: Reported on 03/20/2020)    . ibuprofen (ADVIL) 600 MG tablet Take 1 tablet (600 mg total) by mouth every 6 (six) hours as needed. 60 tablet 0  . Multiple Vitamins-Minerals (WOMENS ONE DAILY PO) Take 1 tablet by mouth daily. Gummies (Patient not taking: Reported on 03/20/2020)     No current facility-administered medications for this visit.    Review of Systems  Constitutional: Negative for chills, fatigue, fever and unexpected weight change.  HENT: Negative for trouble swallowing.  Eyes: Negative for loss of vision.  Respiratory: Negative for cough, shortness of breath and wheezing.  Cardiovascular: Negative for chest pain, leg swelling, palpitations and syncope.  GI: Negative for abdominal pain, blood in stool, diarrhea, nausea and vomiting.  GU: Negative for difficulty urinating, dysuria, frequency and hematuria.  Musculoskeletal: Negative for back pain, leg pain and joint pain.  Skin: Negative for rash.  Neurological: Negative for dizziness, headaches, light-headedness, numbness and seizures.  Psychiatric: Negative for behavioral problem, confusion, depressed mood and sleep disturbance.        Objective:  Objective   Vitals:   05/11/20 0828  BP: 120/74  Weight: 246 lb 12.8 oz (111.9 kg)  Height: 6' 2.5" (1.892 m)   Body mass index is 31.26 kg/m.  Physical Exam Vitals and  nursing note reviewed.  Constitutional:      Appearance: She is well-developed and well-nourished.  HENT:     Head: Normocephalic and atraumatic.  Eyes:     Extraocular Movements: EOM normal.     Pupils: Pupils are equal, round, and reactive to light.  Cardiovascular:     Rate and Rhythm: Normal rate and regular rhythm.  Pulmonary:     Effort: Pulmonary effort is normal. No respiratory distress.  Genitourinary:    Comments: External: Normal appearing vulva. No lesions noted.  Speculum examination: Healing  cuff, intact, suture present, small granaulation tissue less than 0.5 cm patient's left. No blood in the vaginal vault. Thin yellow discharge.   Skin:    General: Skin is warm and dry.  Neurological:     Mental Status: She is alert and oriented to person, place, and time.  Psychiatric:        Mood and Affect: Mood and affect normal.        Behavior: Behavior normal.        Thought Content: Thought content normal.        Judgment: Judgment normal.    Wet Prep: Clue Cells: Positive Fungal elements: Negative Trichomonas: Negative   Assessment/Plan:     44 yo with bacterial vaginosis, s/p hysterectomy Will treat with Flagyl BID x 7 days Discussed granulation tissue, and cuff still healing. Recheck in 4 weeks.   More than 20 minutes were spent face to face with the patient in the room, reviewing the medical record, labs and images, and coordinating care for the patient. The plan of management was discussed in detail and counseling was provided.      Adrian Prows MD [facility], [facility] 05/11/2020 9:01 AM

## 2020-05-16 ENCOUNTER — Ambulatory Visit: Payer: No Typology Code available for payment source | Admitting: Obstetrics and Gynecology

## 2020-05-18 ENCOUNTER — Inpatient Hospital Stay: Payer: No Typology Code available for payment source | Attending: Oncology

## 2020-05-18 ENCOUNTER — Other Ambulatory Visit: Payer: Self-pay

## 2020-05-18 DIAGNOSIS — D649 Anemia, unspecified: Secondary | ICD-10-CM

## 2020-05-18 DIAGNOSIS — D500 Iron deficiency anemia secondary to blood loss (chronic): Secondary | ICD-10-CM | POA: Insufficient documentation

## 2020-05-18 DIAGNOSIS — Z800 Family history of malignant neoplasm of digestive organs: Secondary | ICD-10-CM | POA: Insufficient documentation

## 2020-05-18 DIAGNOSIS — N939 Abnormal uterine and vaginal bleeding, unspecified: Secondary | ICD-10-CM

## 2020-05-18 LAB — CBC
HCT: 44.8 % (ref 36.0–46.0)
Hemoglobin: 13.6 g/dL (ref 12.0–15.0)
MCH: 24.6 pg — ABNORMAL LOW (ref 26.0–34.0)
MCHC: 30.4 g/dL (ref 30.0–36.0)
MCV: 81 fL (ref 80.0–100.0)
Platelets: 309 10*3/uL (ref 150–400)
RBC: 5.53 MIL/uL — ABNORMAL HIGH (ref 3.87–5.11)
RDW: 22.4 % — ABNORMAL HIGH (ref 11.5–15.5)
WBC: 9.5 10*3/uL (ref 4.0–10.5)
nRBC: 0 % (ref 0.0–0.2)

## 2020-05-21 ENCOUNTER — Inpatient Hospital Stay: Payer: No Typology Code available for payment source

## 2020-05-21 ENCOUNTER — Encounter: Payer: Self-pay | Admitting: Oncology

## 2020-05-21 ENCOUNTER — Telehealth: Payer: Self-pay | Admitting: Oncology

## 2020-05-21 ENCOUNTER — Inpatient Hospital Stay (HOSPITAL_BASED_OUTPATIENT_CLINIC_OR_DEPARTMENT_OTHER): Payer: No Typology Code available for payment source | Admitting: Oncology

## 2020-05-21 VITALS — BP 137/86 | HR 58 | Temp 96.6°F | Resp 16 | Ht 74.5 in | Wt 250.0 lb

## 2020-05-21 DIAGNOSIS — D500 Iron deficiency anemia secondary to blood loss (chronic): Secondary | ICD-10-CM | POA: Diagnosis not present

## 2020-05-21 DIAGNOSIS — D75839 Thrombocytosis, unspecified: Secondary | ICD-10-CM

## 2020-05-21 DIAGNOSIS — D509 Iron deficiency anemia, unspecified: Secondary | ICD-10-CM | POA: Diagnosis not present

## 2020-05-21 DIAGNOSIS — N920 Excessive and frequent menstruation with regular cycle: Secondary | ICD-10-CM | POA: Diagnosis not present

## 2020-05-21 NOTE — Telephone Encounter (Signed)
Received notification from Authorization team that pt Krista Garza was not approved yet. Called pt with no answer. Left a detailed message that treatment was not approved yet,but if she wanted to attend the MD appt and have infusion at a later date that could be arranged.

## 2020-05-21 NOTE — Progress Notes (Signed)
[place]  [phone] [phone]  ID: Krista Garza OB: Jul 17, 1975  MR#: [id number]  WNU#:[id number]  Patient Care Team: Delsa Grana, PA-C as PCP - General (Family Medicine) Lloyd Huger, MD as Consulting Physician (Oncology)  CHIEF COMPLAINT: Iron deficiency anemia.  INTERVAL HISTORY: Patient returns to clinic today for repeat laboratory work, further evaluation, and consideration of addition IV Feraheme.  She was last seen in clinic on 03/20/2020.  She last received IV Feraheme on 03/20/2020 and 03/27/2020.  She had a vaginal hysterectomy for heavy abnormal uterine bleeding on 03/13/2020 without significant complications.  This improved her symptoms dramatically.  She was treated for bacterial vaginosis on 05/11/2020 with Flagyl twice daily x7 days.  Today, she continues to do well since her hysterectomy.  She has occasional spotting and pelvic pain but was ensured this was normal.  Her energy levels have improved since she is no longer bleeding.  She took a fall coming out of the shower but did not injure herself.  REVIEW OF SYSTEMS:   Review of Systems  All other systems reviewed and are negative.   As per HPI. Otherwise, a complete review of systems is negative.  PAST MEDICAL HISTORY: Past Medical History:  Diagnosis Date  . Anemia    IRON DEFICIENCY. REQUIRED FERAHEME INFUSIONS.  Marland Kitchen Chest pain, unspecified     PAST SURGICAL HISTORY: Past Surgical History:  Procedure Laterality Date  . CESAREAN SECTION     x2  . CYSTOSCOPY N/A 03/13/2020   Procedure: CYSTOSCOPY;  Surgeon: Homero Fellers, MD;  Location: [facility];  Service: Gynecology;  Laterality: N/A;  . DILATION AND CURETTAGE OF UTERUS    . HYSTEROSCOPY WITH D & C N/A 06/21/2019   Procedure: DILATATION AND CURETTAGE /HYSTEROSCOPY, Polypectomy with myosure;  Surgeon: Homero Fellers, MD;  Location: [facility];  Service: Gynecology;  Laterality: N/A;  . TOTAL  LAPAROSCOPIC HYSTERECTOMY WITH SALPINGECTOMY Bilateral 03/13/2020   Procedure: TOTAL LAPAROSCOPIC HYSTERECTOMY WITH BILATERAL SALPINGECTOMY;  Surgeon: Homero Fellers, MD;  Location: [facility];  Service: Gynecology;  Laterality: Bilateral;    FAMILY HISTORY: Family History  Problem Relation Age of Onset  . Diabetes Father   . Hyperlipidemia Father   . Hypertension Father   . Cancer Father 59       colon  . Colon cancer Father     ADVANCED DIRECTIVES (Y/N):  N  HEALTH MAINTENANCE: Social History   Tobacco Use  . Smoking status: Never Smoker  . Smokeless tobacco: Never Used  Vaping Use  . Vaping Use: Never used  Substance Use Topics  . Alcohol use: Yes    Comment: margaritas occasionally  . Drug use: No     Colonoscopy:  PAP:  Bone density:  Lipid panel:  Allergies  Allergen Reactions  . Adhesive [Tape] Rash    Paper tape okay.  bandaids are a problem.    Current Outpatient Medications  Medication Sig Dispense Refill  . B Complex-C (VITAMIN B + C COMPLEX) TABS Take 1 tablet by mouth daily. With zinc (Patient not taking: Reported on 03/20/2020)    . ibuprofen (ADVIL) 600 MG tablet Take 1 tablet (600 mg total) by mouth every 6 (six) hours as needed. 60 tablet 0  . Multiple Vitamins-Minerals (WOMENS ONE DAILY PO) Take 1 tablet by mouth daily. Gummies (Patient not taking: Reported on 03/20/2020)     No current facility-administered medications for this visit.    OBJECTIVE: There were no vitals filed for this  visit.   There is no height or weight on file to calculate BMI.    ECOG FS:0 - Asymptomatic  Physical Exam Constitutional:      General: Vital signs are normal.     Appearance: Normal appearance.  HENT:     Head: Normocephalic and atraumatic.  Eyes:     Pupils: Pupils are equal, round, and reactive to light.  Cardiovascular:     Rate and Rhythm: Normal rate and regular rhythm.     Heart sounds: Normal heart sounds. No murmur heard.   Pulmonary:      Effort: Pulmonary effort is normal.     Breath sounds: Normal breath sounds. No wheezing.  Abdominal:     General: Bowel sounds are normal. There is no distension.     Palpations: Abdomen is soft.     Tenderness: There is no abdominal tenderness.  Musculoskeletal:        General: No edema. Normal range of motion.     Cervical back: Normal range of motion.  Skin:    General: Skin is warm and dry.     Findings: No rash.  Neurological:     Mental Status: She is alert and oriented to person, place, and time.  Psychiatric:        Judgment: Judgment normal.      LAB RESULTS:  Lab Results  Component Value Date   NA 137 03/02/2019   K 4.2 03/02/2019   CL 104 03/02/2019   CO2 26 03/02/2019   GLUCOSE 83 03/02/2019   BUN 11 03/02/2019   CREATININE 0.70 03/02/2019   CALCIUM 9.4 03/02/2019   PROT 7.3 03/02/2019   AST 15 03/02/2019   ALT 8 03/02/2019   BILITOT 0.3 03/02/2019   GFRNONAA 105 03/02/2019   GFRAA 121 03/02/2019    Lab Results  Component Value Date   WBC 9.5 05/18/2020   NEUTROABS 6.2 03/19/2020   HGB 13.6 05/18/2020   HCT 44.8 05/18/2020   MCV 81.0 05/18/2020   PLT 309 05/18/2020   Lab Results  Component Value Date   IRON 14 (L) 03/19/2020   TIBC 414 03/19/2020   IRONPCTSAT 3 (L) 03/19/2020   Lab Results  Component Value Date   FERRITIN 3 (L) 03/19/2020     STUDIES: No results found.  ASSESSMENT: Iron deficiency anemia secondary to chronic blood loss.  PLAN:    1.  Iron deficiency anemia:  -Secondary to abnormal uterine bleeding. -She is status post hysterectomy (03/13/20) which has significantly improved her symptoms. -She last received IV Feraheme on 09/12/2019, 10/10/2019 and 12/13/2019. -Labs from 05/18/2020 show significant improvement of her hemoglobin from 9.7-13.6.  Iron labs were not drawn.  Based on hemoglobin, patient does not need an iron infusion.  2.  Heavy menses:  -Resolved with hysterectomy. -Followed by gynecology.  3.   Thrombocytosis:  -Secondary to iron deficiency.  -Platelet count has normalized.  Disposition: -RTC in 6 months with repeat labs and assessment. -If labs continue to be WNL, can likely be discharged from clinic  Greater than 50% was spent in counseling and coordination of care with this patient including but not limited to discussion of the relevant topics above (See A&P) including, but not limited to diagnosis and management of acute and chronic medical conditions.   Patient expressed understanding and was in agreement with this plan. She also understands that She can call clinic at any time with any questions, concerns, or complaints.    Jacquelin Hawking, NP  05/21/2020 10:05 AM

## 2020-05-28 NOTE — Telephone Encounter (Signed)
Please call and schedule patient for a visit

## 2020-05-29 ENCOUNTER — Ambulatory Visit: Payer: No Typology Code available for payment source | Admitting: Obstetrics and Gynecology

## 2020-06-07 ENCOUNTER — Ambulatory Visit (INDEPENDENT_AMBULATORY_CARE_PROVIDER_SITE_OTHER): Payer: No Typology Code available for payment source | Admitting: Obstetrics and Gynecology

## 2020-06-07 ENCOUNTER — Encounter: Payer: Self-pay | Admitting: Obstetrics and Gynecology

## 2020-06-07 ENCOUNTER — Other Ambulatory Visit: Payer: Self-pay

## 2020-06-07 VITALS — BP 138/74 | Ht 74.5 in | Wt 238.2 lb

## 2020-06-07 DIAGNOSIS — R1084 Generalized abdominal pain: Secondary | ICD-10-CM

## 2020-06-07 DIAGNOSIS — R140 Abdominal distension (gaseous): Secondary | ICD-10-CM

## 2020-06-07 NOTE — Patient Instructions (Signed)
Abdominal Pain, Adult Pain in the abdomen (abdominal pain) can be caused by many things. Often, abdominal pain is not serious and it gets better with no treatment or by being treated at home. However, sometimes abdominal pain is serious. Your health care provider will ask questions about your medical history and do a physical exam to try to determine the cause of your abdominal pain. Follow these instructions at home: Medicines  Take over-the-counter and prescription medicines only as told by your health care provider.  Do not take a laxative unless told by your health care provider. General instructions  Watch your condition for any changes.  Drink enough fluid to keep your urine pale yellow.  Keep all follow-up visits as told by your health care provider. This is important.   Contact a health care provider if:  Your abdominal pain changes or gets worse.  You are not hungry or you lose weight without trying.  You are constipated or have diarrhea for more than 2-3 days.  You have pain when you urinate or have a bowel movement.  Your abdominal pain wakes you up at night.  Your pain gets worse with meals, after eating, or with certain foods.  You are vomiting and cannot keep anything down.  You have a fever.  You have blood in your urine. Get help right away if:  Your pain does not go away as soon as your health care provider told you to expect.  You cannot stop vomiting.  Your pain is only in areas of the abdomen, such as the right side or the left lower portion of the abdomen. Pain on the right side could be caused by appendicitis.  You have bloody or black stools, or stools that look like tar.  You have severe pain, cramping, or bloating in your abdomen.  You have signs of dehydration, such as: ? Dark urine, very little urine, or no urine. ? Cracked lips. ? Dry mouth. ? Sunken eyes. ? Sleepiness. ? Weakness.  You have trouble breathing or chest  pain. Summary  Often, abdominal pain is not serious and it gets better with no treatment or by being treated at home. However, sometimes abdominal pain is serious.  Watch your condition for any changes.  Take over-the-counter and prescription medicines only as told by your health care provider.  Contact a health care provider if your abdominal pain changes or gets worse.  Get help right away if you have severe pain, cramping, or bloating in your abdomen. This information is not intended to replace advice given to you by your health care provider. Make sure you discuss any questions you have with your health care provider. Document Revised: 04/29/2019 Document Reviewed: 07/19/2018 Elsevier Patient Education  [employer].

## 2020-06-07 NOTE — Progress Notes (Signed)
  Postoperative Follow-up Patient presents post op from laparoscopic total hysterectomy with bilateral salpingectomy  for  Abnormal uterine bleeding , 12 weeks ago.  Subjective: Patient reports some improvement in her preop symptoms. Eating a regular diet without difficulty. She is having abdominal pain and gaseous distension pain. Some issues with constipation since the surgery. She is taking gas-x and colace.  Activity: normal activities of daily living. Patient reports additional symptom's since surgery of None.  She is having gaseous abdominal pain, sees blood drop into the toilet but is unsure of the source. Sometimes with defecation, sometimes with urination  She is also feeling very fatigued. Difficulty sleeping more than 4-5 hours at night. Not comfortable taking melatonin, concern for missing her alarm in the morning.   Is not yet sexually active, but would like to be.   Do you snore loudly? ( louder than talking or loud enough to be heard through closed doors?) NO Do you often feel tired, fatigued, or sleepy during daytime? YES Has anyone observed you stop breathing during sleep? NO Do you have or are you being treated for high blood pressure? NO BMI> 35 NO Age> 50 NO Neck circumference > 40 cm No-38 cm Female gender? NO ** if yes to > 3 questions high risk of obstructive sleep apnea ** if yes to <3 questions+ low risk for obstructive sleep apnea  Objective: LMP 04/18/2019 (Approximate) Comment: 9 weeks ongoing menses Physical Exam Constitutional:      Appearance: She is well-developed.  Genitourinary:     Genitourinary Comments: External: Normal appearing vulva. No lesions noted.  Speculum examination: Vaginal cuff intact. 1 cm of friable granulation tissue at the cuff, left side. No blood in the vaginal vault. No discharge.   Bimanual examination: tender with light palpation at vaginal cuff  HENT:     Head: Normocephalic and atraumatic.  Neck:     Thyroid: No  thyromegaly.  Cardiovascular:     Rate and Rhythm: Normal rate and regular rhythm.     Heart sounds: Normal heart sounds.  Pulmonary:     Effort: Pulmonary effort is normal.     Breath sounds: Normal breath sounds.  Abdominal:     General: Bowel sounds are normal. There is no distension.     Palpations: Abdomen is soft. There is no mass.  Musculoskeletal:     Cervical back: Neck supple.  Neurological:     Mental Status: She is alert and oriented to person, place, and time.  Skin:    General: Skin is warm and dry.  Psychiatric:        Behavior: Behavior normal.        Thought Content: Thought content normal.        Judgment: Judgment normal.  Vitals reviewed.     Assessment: s/p :   laparoscopic total hysterectomy with bilateral salpingectomy stable  Plan: Patient has done well after surgery with no apparent complications.  I have discussed the post-operative course to date, and the expected progress moving forward.  The patient understands what complications to be concerned about.  I will see the patient in routine follow up, or sooner if needed.    Discussed granulation tissue, offered surgical revision, but she is not ready for that at this time.  Will obtain abdominal/pelvic CT  Activity plan: No restriction.  Adrian Prows MD, [place], [facility] 06/07/2020 1:45 PM

## 2020-06-26 ENCOUNTER — Ambulatory Visit: Payer: No Typology Code available for payment source | Admitting: Obstetrics and Gynecology

## 2020-06-29 ENCOUNTER — Other Ambulatory Visit: Payer: Self-pay

## 2020-06-29 ENCOUNTER — Ambulatory Visit
Admission: RE | Admit: 2020-06-29 | Discharge: 2020-06-29 | Disposition: A | Discharge status: Home / Self Care | Payer: No Typology Code available for payment source | Source: Ambulatory Visit | Attending: Obstetrics and Gynecology | Admitting: Obstetrics and Gynecology

## 2020-06-29 DIAGNOSIS — R140 Abdominal distension (gaseous): Secondary | ICD-10-CM | POA: Diagnosis not present

## 2020-06-29 MED ORDER — IOHEXOL 300 MG/ML  SOLN
100.0000 mL | Freq: Once | INTRAMUSCULAR | Status: AC | PRN
  Administered 2020-06-29: 100 mL via INTRAVENOUS

## 2020-07-02 ENCOUNTER — Other Ambulatory Visit: Payer: Self-pay | Admitting: Obstetrics and Gynecology

## 2020-07-02 DIAGNOSIS — K769 Liver disease, unspecified: Secondary | ICD-10-CM

## 2020-07-02 DIAGNOSIS — R160 Hepatomegaly, not elsewhere classified: Secondary | ICD-10-CM

## 2020-07-05 ENCOUNTER — Encounter: Payer: Self-pay | Admitting: Obstetrics and Gynecology

## 2020-07-05 ENCOUNTER — Ambulatory Visit (INDEPENDENT_AMBULATORY_CARE_PROVIDER_SITE_OTHER): Payer: No Typology Code available for payment source | Admitting: Obstetrics and Gynecology

## 2020-07-05 ENCOUNTER — Other Ambulatory Visit: Payer: Self-pay

## 2020-07-05 VITALS — BP 134/70 | Ht 74.0 in | Wt 243.6 lb

## 2020-07-05 DIAGNOSIS — Z90710 Acquired absence of both cervix and uterus: Secondary | ICD-10-CM | POA: Diagnosis not present

## 2020-07-05 DIAGNOSIS — N898 Other specified noninflammatory disorders of vagina: Secondary | ICD-10-CM | POA: Diagnosis not present

## 2020-07-05 DIAGNOSIS — N941 Unspecified dyspareunia: Secondary | ICD-10-CM

## 2020-07-05 NOTE — Progress Notes (Signed)
Patient ID: Krista Garza, female   DOB: 24-Feb-1976, 45 y.o.   MRN: [id number]  Reason for Consult: Gynecologic Exam   Referred by Delsa Grana, PA-C  Subjective:     HPI:  Krista Garza is a 45 y.o. female. She is here today to follow up her granulation tissue after her hysterectomy. She desires conservative management and is not ready to consider revision at this time. She tried vaginal intercourse but had significant pain that felt like an electrical shock through her body. She described being very tense and having difficulty moving because of the pain.  She recently had liver lesions seen on CT scan and plans to follow up for abdominal MRI  Past Medical History:  Diagnosis Date  . Anemia    IRON DEFICIENCY. REQUIRED FERAHEME INFUSIONS.  Marland Kitchen Chest pain, unspecified    Family History  Problem Relation Age of Onset  . Diabetes Father   . Hyperlipidemia Father   . Hypertension Father   . Cancer Father 47       colon  . Colon cancer Father    Past Surgical History:  Procedure Laterality Date  . CESAREAN SECTION     x2  . CYSTOSCOPY N/A 03/13/2020   Procedure: CYSTOSCOPY;  Surgeon: Homero Fellers, MD;  Location: [facility];  Service: Gynecology;  Laterality: N/A;  . DILATION AND CURETTAGE OF UTERUS    . HYSTEROSCOPY WITH D & C N/A 06/21/2019   Procedure: DILATATION AND CURETTAGE /HYSTEROSCOPY, Polypectomy with myosure;  Surgeon: Homero Fellers, MD;  Location: [facility];  Service: Gynecology;  Laterality: N/A;  . TOTAL LAPAROSCOPIC HYSTERECTOMY WITH SALPINGECTOMY Bilateral 03/13/2020   Procedure: TOTAL LAPAROSCOPIC HYSTERECTOMY WITH BILATERAL SALPINGECTOMY;  Surgeon: Homero Fellers, MD;  Location: [facility];  Service: Gynecology;  Laterality: Bilateral;    Short Social History:  Social History   Tobacco Use  . Smoking status: Never Smoker  . Smokeless tobacco: Never Used  Substance Use Topics  . Alcohol use: Yes    Comment: margaritas occasionally     Allergies  Allergen Reactions  . Adhesive [Tape] Rash    Paper tape okay.  bandaids are a problem.    Current Outpatient Medications  Medication Sig Dispense Refill  . B Complex-C (VITAMIN B + C COMPLEX) TABS Take 1 tablet by mouth daily. With zinc    . ibuprofen (ADVIL) 600 MG tablet TAKE 1 TABLET BY MOUTH EVERY 6 HOURS AS NEEDED. 60 tablet 0  . metroNIDAZOLE (FLAGYL) 500 MG tablet TAKE 1 TABLET BY MOUTH TWICE DAILY FOR 7 DAYS. (Patient not taking: Reported on 07/05/2020) 14 tablet 0  . Multiple Vitamins-Minerals (WOMENS ONE DAILY PO) Take 1 tablet by mouth daily. Gummies     No current facility-administered medications for this visit.    Review of Systems  Constitutional: Negative for chills, fatigue, fever and unexpected weight change.  HENT: Negative for trouble swallowing.  Eyes: Negative for loss of vision.  Respiratory: Negative for cough, shortness of breath and wheezing.  Cardiovascular: Negative for chest pain, leg swelling, palpitations and syncope.  GI: Negative for abdominal pain, blood in stool, diarrhea, nausea and vomiting.  GU: Negative for difficulty urinating, dysuria, frequency and hematuria.  Musculoskeletal: Negative for back pain, leg pain and joint pain.  Skin: Negative for rash.  Neurological: Negative for dizziness, headaches, light-headedness, numbness and seizures.  Psychiatric: Negative for behavioral problem, confusion, depressed mood and sleep disturbance.        Objective:  Objective  Vitals:   07/05/20 1547  BP: 134/70  Weight: 243 lb 9.6 oz (110.5 kg)  Height: 6\' 2"  (1.88 m)   Body mass index is 31.28 kg/m.  Physical Exam Vitals and nursing note reviewed. Exam conducted with a chaperone present.  Constitutional:      Appearance: Normal appearance. She is well-developed.  HENT:     Head: Normocephalic and atraumatic.  Eyes:     Extraocular Movements: Extraocular movements intact.     Pupils: Pupils are equal, round, and  reactive to light.  Cardiovascular:     Rate and Rhythm: Normal rate and regular rhythm.  Pulmonary:     Effort: Pulmonary effort is normal. No respiratory distress.     Breath sounds: Normal breath sounds.  Abdominal:     General: Abdomen is flat.     Palpations: Abdomen is soft.  Genitourinary:    Comments: External: Normal appearing vulva. No lesions noted.  Speculum examination: Intact vaginal cuff. Small granulation tissue at apex. Pinkish discharge in vagina. Silver nitrate applied to granulation tissue.  Musculoskeletal:        General: No signs of injury.  Skin:    General: Skin is warm and dry.  Neurological:     Mental Status: She is alert and oriented to person, place, and time.  Psychiatric:        Behavior: Behavior normal.        Thought Content: Thought content normal.        Judgment: Judgment normal.     Assessment/Plan:     45 yo with vaginal vault granulation tissue after hysterectomy Silver nitrate applied today.  Will follow up in 4 weeks for second treatment if needed  More than 5 minutes were spent face to face with the patient in the room, reviewing the medical record, labs and images, and coordinating care for the patient. The plan of management was discussed in detail and counseling was provided.   Adrian Prows MD [facility], [facility] 07/05/2020 5:19 PM

## 2020-07-12 ENCOUNTER — Ambulatory Visit: Payer: No Typology Code available for payment source

## 2020-08-21 ENCOUNTER — Ambulatory Visit: Payer: No Typology Code available for payment source

## 2020-11-19 ENCOUNTER — Inpatient Hospital Stay: Payer: No Typology Code available for payment source | Attending: Oncology

## 2020-11-19 ENCOUNTER — Encounter: Payer: Self-pay | Admitting: Oncology

## 2020-11-19 ENCOUNTER — Inpatient Hospital Stay (HOSPITAL_BASED_OUTPATIENT_CLINIC_OR_DEPARTMENT_OTHER): Payer: No Typology Code available for payment source | Admitting: Oncology

## 2020-11-19 ENCOUNTER — Other Ambulatory Visit: Payer: Self-pay

## 2020-11-19 VITALS — BP 136/92 | HR 72 | Temp 96.5°F | Resp 16 | Wt 253.3 lb

## 2020-11-19 DIAGNOSIS — N939 Abnormal uterine and vaginal bleeding, unspecified: Secondary | ICD-10-CM | POA: Insufficient documentation

## 2020-11-19 DIAGNOSIS — D500 Iron deficiency anemia secondary to blood loss (chronic): Secondary | ICD-10-CM | POA: Diagnosis not present

## 2020-11-19 DIAGNOSIS — D75839 Thrombocytosis, unspecified: Secondary | ICD-10-CM | POA: Diagnosis not present

## 2020-11-19 LAB — CBC WITH DIFFERENTIAL/PLATELET
Abs Immature Granulocytes: 0.01 10*3/uL (ref 0.00–0.07)
Basophils Absolute: 0 10*3/uL (ref 0.0–0.1)
Basophils Relative: 1 %
Eosinophils Absolute: 0.2 10*3/uL (ref 0.0–0.5)
Eosinophils Relative: 3 %
HCT: 46.9 % — ABNORMAL HIGH (ref 36.0–46.0)
Hemoglobin: 15 g/dL (ref 12.0–15.0)
Immature Granulocytes: 0 %
Lymphocytes Relative: 26 %
Lymphs Abs: 1.6 10*3/uL (ref 0.7–4.0)
MCH: 27.7 pg (ref 26.0–34.0)
MCHC: 32 g/dL (ref 30.0–36.0)
MCV: 86.7 fL (ref 80.0–100.0)
Monocytes Absolute: 0.6 10*3/uL (ref 0.1–1.0)
Monocytes Relative: 10 %
Neutro Abs: 3.8 10*3/uL (ref 1.7–7.7)
Neutrophils Relative %: 60 %
Platelets: 270 10*3/uL (ref 150–400)
RBC: 5.41 MIL/uL — ABNORMAL HIGH (ref 3.87–5.11)
RDW: 13.1 % (ref 11.5–15.5)
WBC: 6.2 10*3/uL (ref 4.0–10.5)
nRBC: 0 % (ref 0.0–0.2)

## 2020-11-19 LAB — VITAMIN B12: Vitamin B-12: 2213 pg/mL — ABNORMAL HIGH (ref 180–914)

## 2020-11-19 LAB — IRON AND TIBC
Iron: 29 ug/dL (ref 28–170)
Saturation Ratios: 8 % — ABNORMAL LOW (ref 10.4–31.8)
TIBC: 375 ug/dL (ref 250–450)
UIBC: 346 ug/dL

## 2020-11-19 LAB — FERRITIN: Ferritin: 13 ng/mL (ref 11–307)

## 2020-11-19 NOTE — Progress Notes (Signed)
Patient denies new problems/concerns today.   °

## 2020-11-19 NOTE — Progress Notes (Signed)
[place]  [phone] [phone]  ID: [name] OB: 08/19/1975  MR#: [id number]  [id number]  Patient Care Team: Delsa Grana, PA-C as PCP - General (Family Medicine) Lloyd Huger, MD as Consulting Physician (Oncology)  CHIEF COMPLAINT: Iron deficiency anemia.  HPI: Mrs. Krista Garza is a 45 year old female with past medical history significant for endometrial polyp, abnormal uterine bleeding, menorrhagia, endometriosis and IDA who is followed by Dr. Grayland Ormond and has received intermittent IV iron.  She last received IV Feraheme on 03/20/2020 and 03/27/2020.  She had a vaginal hysterectomy on 03/13/2020 due to heavy abnormal uterine bleeding and tolerated well.  Vaginal bleeding has essentially stopped.  INTERVAL HISTORY:  She returns today for 35-monthfollow-up with lab work.  She continues to report doing well since her hysterectomy.  Denies any bleeding or concerns at this time.  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  [place]    REVIEW OF SYSTEMS:   Review of Systems  Constitutional: Negative.  Negative for chills, fever, malaise/fatigue and weight loss.  HENT:  Negative for congestion, ear pain and tinnitus.   Eyes: Negative.  Negative for blurred vision and double vision.  Respiratory: Negative.  Negative for cough, sputum production and shortness of breath.   Cardiovascular: Negative.  Negative for chest pain, palpitations and leg swelling.  Gastrointestinal: Negative.  Negative for abdominal pain, constipation, diarrhea, nausea and vomiting.  Genitourinary:  Negative for dysuria, frequency and urgency.  Musculoskeletal:  Negative for back pain and falls.  Skin: Negative.  Negative for rash.  Neurological: Negative.  Negative for weakness and headaches.  Endo/Heme/Allergies: Negative.  Does not bruise/bleed easily.  Psychiatric/Behavioral: Negative.  Negative  for depression. The patient is not nervous/anxious and does not have insomnia.    As per HPI. Otherwise, a complete review of systems is negative.  PAST MEDICAL HISTORY: Past Medical History:  Diagnosis Date   Anemia    IRON DEFICIENCY. REQUIRED FERAHEME INFUSIONS.   Chest pain, unspecified     PAST SURGICAL HISTORY: Past Surgical History:  Procedure Laterality Date   CESAREAN SECTION     x2   CYSTOSCOPY N/A 03/13/2020   Procedure: CYSTOSCOPY;  Surgeon: SHomero Fellers MD;  Location: [facility];  Service: Gynecology;  Laterality: N/A;   DILATION AND CURETTAGE OF UTERUS     HYSTEROSCOPY WITH D & C N/A 06/21/2019   Procedure: DILATATION AND CURETTAGE /HYSTEROSCOPY, Polypectomy with myosure;  Surgeon: SHomero Fellers MD;  Location: [facility];  Service: Gynecology;  Laterality: N/A;   TOTAL LAPAROSCOPIC HYSTERECTOMY WITH SALPINGECTOMY Bilateral 03/13/2020   Procedure: TOTAL LAPAROSCOPIC HYSTERECTOMY WITH BILATERAL SALPINGECTOMY;  Surgeon: SHomero Fellers MD;  Location: [facility];  Service: Gynecology;  Laterality: Bilateral;    FAMILY HISTORY: Family History  Problem Relation Age of Onset   Diabetes Father    Hyperlipidemia Father    Hypertension Father    Cancer Father 471      colon   Colon cancer Father     ADVANCED DIRECTIVES (Y/N):  N  HEALTH MAINTENANCE: Social History   Tobacco Use   Smoking status: Never   Smokeless tobacco: Never  Vaping Use   Vaping Use: Never used  Substance Use Topics   Alcohol use: Yes    Comment: margaritas occasionally   Drug use: No     Colonoscopy:  PAP:  Bone density:  Lipid panel:  Allergies  Allergen Reactions   Adhesive [Tape] Rash    Paper tape okay.  bandaids are a problem.    Current Outpatient Medications  Medication Sig Dispense Refill   B Complex-C (VITAMIN B + C COMPLEX) TABS Take 1 tablet by mouth daily. With zinc     ibuprofen (ADVIL) 600 MG tablet TAKE 1 TABLET BY MOUTH EVERY 6 HOURS AS  NEEDED. 60 tablet 0   Multiple Vitamins-Minerals (WOMENS ONE DAILY PO) Take 1 tablet by mouth daily. Gummies     metroNIDAZOLE (FLAGYL) 500 MG tablet TAKE 1 TABLET BY MOUTH TWICE DAILY FOR 7 DAYS. (Patient not taking: No sig reported) 14 tablet 0   No current facility-administered medications for this visit.    OBJECTIVE: Vitals:   11/19/20 1327  BP: (!) 136/92  Pulse: 72  Resp: 16  Temp: (!) 96.5 F (35.8 C)     Body mass index is 32.52 kg/m.    ECOG FS:0 - Asymptomatic  Physical Exam Constitutional:      Appearance: Normal appearance.  HENT:     Head: Normocephalic and atraumatic.  Eyes:     Pupils: Pupils are equal, round, and reactive to light.  Cardiovascular:     Rate and Rhythm: Normal rate and regular rhythm.     Heart sounds: Normal heart sounds. No murmur heard. Pulmonary:     Effort: Pulmonary effort is normal.     Breath sounds: Normal breath sounds. No wheezing.  Abdominal:     General: Bowel sounds are normal. There is no distension.     Palpations: Abdomen is soft.     Tenderness: There is no abdominal tenderness.  Musculoskeletal:        General: Normal range of motion.     Cervical back: Normal range of motion.  Skin:    General: Skin is warm and dry.     Findings: No rash.  Neurological:     Mental Status: She is alert and oriented to person, place, and time.  Psychiatric:        Judgment: Judgment normal.     LAB RESULTS:  Lab Results  Component Value Date   NA 137 03/02/2019   K 4.2 03/02/2019   CL 104 03/02/2019   CO2 26 03/02/2019   GLUCOSE 83 03/02/2019   BUN 11 03/02/2019   CREATININE 0.70 03/02/2019   CALCIUM 9.4 03/02/2019   PROT 7.3 03/02/2019   AST 15 03/02/2019   ALT 8 03/02/2019   BILITOT 0.3 03/02/2019   GFRNONAA 105 03/02/2019   GFRAA 121 03/02/2019    Lab Results  Component Value Date   WBC 6.2 11/19/2020   NEUTROABS 3.8 11/19/2020   HGB 15.0 11/19/2020   HCT 46.9 (H) 11/19/2020   MCV 86.7 11/19/2020   PLT 270  11/19/2020   Lab Results  Component Value Date   IRON 29 11/19/2020   TIBC 375 11/19/2020   IRONPCTSAT 8 (L) 11/19/2020   Lab Results  Component Value Date   FERRITIN 13 11/19/2020     STUDIES: No results found.  ASSESSMENT: Iron deficiency anemia secondary to chronic blood loss.  PLAN:    1.  Iron deficiency anemia:  She has history of abnormal uterine bleeding and is status post hysterectomy on 03/13/2020 with significant improvement of her symptoms.  She last received IV Feraheme on 03/20/2020 and 03/27/2020.  Since her hysterectomy, she denies any bleeding.  Labs from 11/19/2020 show a hemoglobin of 15.0, ferritin 13 and iron saturation of 8%.  Recommend  she start taking oral iron tablets as she is able to tolerate verse 1 dose of IV Feraheme.  2.  Heavy menses:  This has resolved since her hysterectomy.  She is followed by gynecology.  3.  Thrombocytosis:  Reactive.  Secondary to iron deficiency.  Counts have normalized.  Platelet count is 270,000 from 11/19/2020.  Disposition- Start oral iron tablets.  Recommend 1 additional follow-up in 6 months to make sure her counts are improving.  I spent 25 minutes dedicated to the care of this patient (face-to-face and non-face-to-face) on the date of the encounter to include what is described in the assessment and plan.  Patient expressed understanding and was in agreement with this plan. She also understands that She can call clinic at any time with any questions, concerns, or complaints.    Jacquelin Hawking, NP   11/20/2020 10:52 AM

## 2020-11-20 ENCOUNTER — Encounter: Payer: Self-pay | Admitting: Oncology

## 2020-11-20 NOTE — Addendum Note (Signed)
Addended by: Faythe Casa E on: 11/20/2020 10:55 AM   Modules accepted: Orders

## 2020-11-23 ENCOUNTER — Other Ambulatory Visit: Payer: Self-pay

## 2020-11-23 ENCOUNTER — Inpatient Hospital Stay: Payer: No Typology Code available for payment source | Attending: Oncology

## 2020-11-23 VITALS — BP 139/77 | HR 73 | Temp 96.0°F | Resp 17

## 2020-11-23 DIAGNOSIS — D500 Iron deficiency anemia secondary to blood loss (chronic): Secondary | ICD-10-CM | POA: Diagnosis not present

## 2020-11-23 DIAGNOSIS — N939 Abnormal uterine and vaginal bleeding, unspecified: Secondary | ICD-10-CM | POA: Insufficient documentation

## 2020-11-23 DIAGNOSIS — D75839 Thrombocytosis, unspecified: Secondary | ICD-10-CM | POA: Diagnosis not present

## 2020-11-23 MED ORDER — SODIUM CHLORIDE 0.9 % IV SOLN
Freq: Once | INTRAVENOUS | Status: AC
  Filled 2020-11-23: qty 250

## 2020-11-23 MED ORDER — SODIUM CHLORIDE 0.9 % IV SOLN
510.0000 mg | Freq: Once | INTRAVENOUS | Status: AC
  Administered 2020-11-23: 510 mg via INTRAVENOUS
  Filled 2020-11-23: qty 510

## 2020-11-23 NOTE — Patient Instructions (Signed)
Ferumoxytol Injection What is this medication? FERUMOXYTOL (FER ue MOX i tol) treats low levels of iron in your body (iron deficiency anemia). Iron is a mineral that plays an important role in making red blood cells, which carry oxygen from your lungs to the rest of your body. This medicine may be used for other purposes; ask your health care provider or pharmacist if you have questions. COMMON BRAND NAME(S): Feraheme What should I tell my care team before I take this medication? They need to know if you have any of these conditions: Anemia not caused by low iron levels High levels of iron in the blood Magnetic resonance imaging (MRI) test scheduled An unusual or allergic reaction to iron, other medications, foods, dyes, or preservatives Pregnant or trying to get pregnant Breast-feeding How should I use this medication? This medication is for injection into a vein. It is given in a hospital or clinic setting. Talk to your care team the use of this medication in children. Special care may be needed. Overdosage: If you think you have taken too much of this medicine contact a poison control center or emergency room at once. NOTE: This medicine is only for you. Do not share this medicine with others. What if I miss a dose? It is important not to miss your dose. Call your care team if you are unable to keep an appointment. What may interact with this medication? Other iron products This list may not describe all possible interactions. Give your health care provider a list of all the medicines, herbs, non-prescription drugs, or dietary supplements you use. Also tell them if you smoke, drink alcohol, or use illegal drugs. Some items may interact with your medicine. What should I watch for while using this medication? Visit your care team regularly. Tell your care team if your symptoms do not start to get better or if they get worse. You may need blood work done while you are taking this  medication. You may need to follow a special diet. Talk to your care team. Foods that contain iron include: whole grains/cereals, dried fruits, beans, or peas, leafy green vegetables, and organ meats (liver, kidney). What side effects may I notice from receiving this medication? Side effects that you should report to your care team as soon as possible: Allergic reactions-skin rash, itching, hives, swelling of the face, lips, tongue, or throat Low blood pressure-dizziness, feeling faint or lightheaded, blurry vision Shortness of breath Side effects that usually do not require medical attention (report to your care team if they continue or are bothersome): Flushing Headache Joint pain Muscle pain Nausea Pain, redness, or irritation at injection site This list may not describe all possible side effects. Call your doctor for medical advice about side effects. You may report side effects to FDA at 1-800-FDA-1088. Where should I keep my medication? This medication is given in a hospital or clinic and will not be stored at home. NOTE: This sheet is a summary. It may not cover all possible information. If you have questions about this medicine, talk to your doctor, pharmacist, or health care provider.  2022 Elsevier/Gold Standard (2020-07-27 15:35:12)  

## 2020-11-23 NOTE — Progress Notes (Signed)
1357: initial b/p 164/95 1408: Recheck b/p 148/100 Pt reports that she has been experiencing blurred vision, headaches and dizziness for "several days".  Dr. Grayland Ormond aware 1415: Per Dr. Grayland Ormond okay to proceed with [facility] and discharge pt home post [facility]. Pt to follow up with PCP regarding b/p and symptoms, and to report to emergency room in the event of an emergency such as but not limited to increased/prolonged symptoms or increased b/p readings. Pt to monitor b/p at home and keep a log. Pt verbalizes understanding.  1445: Pt denies staying for 30 minutes post Feraheme observation period. Pt denies any concerns at this time. Pt and VS stable at discharge.

## 2021-01-03 ENCOUNTER — Other Ambulatory Visit: Payer: Self-pay

## 2021-01-03 ENCOUNTER — Encounter: Payer: Self-pay | Admitting: Nurse Practitioner

## 2021-01-03 ENCOUNTER — Ambulatory Visit (INDEPENDENT_AMBULATORY_CARE_PROVIDER_SITE_OTHER): Payer: No Typology Code available for payment source | Admitting: Nurse Practitioner

## 2021-01-03 VITALS — BP 168/84 | HR 62 | Temp 98.1°F | Resp 16 | Ht 74.5 in | Wt 250.7 lb

## 2021-01-03 DIAGNOSIS — Z13220 Encounter for screening for lipoid disorders: Secondary | ICD-10-CM | POA: Diagnosis not present

## 2021-01-03 DIAGNOSIS — R0683 Snoring: Secondary | ICD-10-CM | POA: Diagnosis not present

## 2021-01-03 DIAGNOSIS — Z131 Encounter for screening for diabetes mellitus: Secondary | ICD-10-CM | POA: Diagnosis not present

## 2021-01-03 DIAGNOSIS — Z1211 Encounter for screening for malignant neoplasm of colon: Secondary | ICD-10-CM

## 2021-01-03 DIAGNOSIS — I10 Essential (primary) hypertension: Secondary | ICD-10-CM

## 2021-01-03 MED ORDER — HYDROCHLOROTHIAZIDE 12.5 MG PO TABS
12.5000 mg | ORAL_TABLET | Freq: Every day | ORAL | 0 refills | Status: DC
  Filled 2021-01-03: qty 30, 30d supply, fill #0

## 2021-01-03 NOTE — Progress Notes (Signed)
BP (!) 168/84   Pulse 62   Temp 98.1 F (36.7 C) (Oral)   Resp 16   Ht 6' 2.5" (1.892 m)   Wt 250 lb 11.2 oz (113.7 kg)   LMP 04/18/2019 (Approximate) Comment: 9 weeks ongoing menses  SpO2 99%   BMI 31.76 kg/m    Subjective:    Patient ID: Krista Garza, female    DOB: [birth year], 45 y.o.   MRN: [id number]  HPI: Krista Garza is a 45 y.o. female, here alone  Chief Complaint  Patient presents with   Hypertension   Hypertension: She says that the last few times she has gone to her oncologist appointment her blood pressure has been high running 164/95, 148/100.  She also has been checking her blood pressure at home and it has been running about the same as it was in the office 160/90-100s  Her blood pressure in the office today is 168/84.  She says she can tell when her blood pressure is higher because she has blurred vision and what feels like a tight band going across her eyes.  She denies chest pain, shortness of breath or edema. Discussed reduced sodium diet.  Will start hydrochlorothiazide 12.5 mg daily.  She will continue to monitor blood pressure and return in two weeks for follow-up.   Snoring: She says she gets about 4 - 7 hours of sleep a night.  She say she has to take naps throughout the day.  Especially on her days off.  She says she has been told that she snores.  She says that she startles herself awake.  Epworth Sleepiness Scale score 22.  Sent referral for sleep study.  Relevant past medical, surgical, family and social history reviewed and updated as indicated. Interim medical history since our last visit reviewed. Allergies and medications reviewed and updated.  Review of Systems  Constitutional: Negative for fever or weight change.  Respiratory: Negative for cough and shortness of breath.   Cardiovascular: Negative for chest pain or palpitations.  Gastrointestinal: Negative for abdominal pain, no bowel changes.  Musculoskeletal: Negative for gait problem or  joint swelling.  Skin: Negative for rash.  Neurological: Positive for dizziness , negative for headache.  No other specific complaints in a complete review of systems (except as listed in HPI above).      Objective:    BP (!) 168/84   Pulse 62   Temp 98.1 F (36.7 C) (Oral)   Resp 16   Ht 6' 2.5" (1.892 m)   Wt 250 lb 11.2 oz (113.7 kg)   LMP 04/18/2019 (Approximate) Comment: 9 weeks ongoing menses  SpO2 99%   BMI 31.76 kg/m   Wt Readings from Last 3 Encounters:  01/03/21 250 lb 11.2 oz (113.7 kg)  11/19/20 253 lb 4.8 oz (114.9 kg)  07/05/20 243 lb 9.6 oz (110.5 kg)    Physical Exam  Constitutional: Patient appears well-developed and well-nourished. No distress.  HEENT: head atraumatic, normocephalic, pupils equal and reactive to light, neck supple Cardiovascular: Normal rate, regular rhythm and normal heart sounds.  No murmur heard. No BLE edema. Pulmonary/Chest: Effort normal and breath sounds normal. No respiratory distress. Abdominal: Soft.  There is no tenderness. Psychiatric: Patient has a normal mood and affect. behavior is normal. Judgment and thought content normal.   Results for orders placed or performed in visit on 11/19/20  Iron and TIBC  Result Value Ref Range   Iron 29 28 - 170 ug/dL   TIBC 375  250 - 450 ug/dL   Saturation Ratios 8 (L) 10.4 - 31.8 %   UIBC 346 ug/dL  Vitamin B12  Result Value Ref Range   Vitamin B-12 2,213 (H) 180 - 914 pg/mL  Ferritin  Result Value Ref Range   Ferritin 13 11 - 307 ng/mL  CBC with Differential/Platelet  Result Value Ref Range   WBC 6.2 4.0 - 10.5 K/uL   RBC 5.41 (H) 3.87 - 5.11 MIL/uL   Hemoglobin 15.0 12.0 - 15.0 g/dL   HCT 46.9 (H) 36.0 - 46.0 %   MCV 86.7 80.0 - 100.0 fL   MCH 27.7 26.0 - 34.0 pg   MCHC 32.0 30.0 - 36.0 g/dL   RDW 13.1 11.5 - 15.5 %   Platelets 270 150 - 400 K/uL   nRBC 0.0 0.0 - 0.2 %   Neutrophils Relative % 60 %   Neutro Abs 3.8 1.7 - 7.7 K/uL   Lymphocytes Relative 26 %   Lymphs Abs  1.6 0.7 - 4.0 K/uL   Monocytes Relative 10 %   Monocytes Absolute 0.6 0.1 - 1.0 K/uL   Eosinophils Relative 3 %   Eosinophils Absolute 0.2 0.0 - 0.5 K/uL   Basophils Relative 1 %   Basophils Absolute 0.0 0.0 - 0.1 K/uL   Immature Granulocytes 0 %   Abs Immature Granulocytes 0.01 0.00 - 0.07 K/uL      Assessment & Plan:   1. Primary hypertension  - COMPLETE METABOLIC PANEL WITH GFR - hydrochlorothiazide (HYDRODIURIL) 12.5 MG tablet; Take 1 tablet (12.5 mg total) by mouth daily.  Dispense: 30 tablet; Refill: 0  2. Snoring  - Home sleep test  3. Screening for diabetes mellitus  - COMPLETE METABOLIC PANEL WITH GFR - Hemoglobin A1c  4. Screening for cholesterol level  - Lipid panel  5. Screening for colon cancer  - Ambulatory referral to Gastroenterology   Follow up plan: Return in about 2 weeks (around 01/17/2021) for follow up.

## 2021-01-04 ENCOUNTER — Other Ambulatory Visit: Payer: Self-pay

## 2021-01-04 DIAGNOSIS — Z1211 Encounter for screening for malignant neoplasm of colon: Secondary | ICD-10-CM

## 2021-01-04 DIAGNOSIS — Z800 Family history of malignant neoplasm of digestive organs: Secondary | ICD-10-CM

## 2021-01-04 LAB — HEMOGLOBIN A1C
Hgb A1c MFr Bld: 5.8 % of total Hgb — ABNORMAL HIGH (ref <5.7)
Mean Plasma Glucose: 120 mg/dL
eAG (mmol/L): 6.6 mmol/L

## 2021-01-04 LAB — COMPLETE METABOLIC PANEL WITH GFR
AG Ratio: 1.4 (calc) (ref 1.0–2.5)
ALT: 10 U/L (ref 6–29)
AST: 12 U/L (ref 10–35)
Albumin: 4.1 g/dL (ref 3.6–5.1)
Alkaline phosphatase (APISO): 58 U/L (ref 31–125)
BUN: 12 mg/dL (ref 7–25)
CO2: 29 mmol/L (ref 20–32)
Calcium: 9.3 mg/dL (ref 8.6–10.2)
Chloride: 101 mmol/L (ref 98–110)
Creat: 0.67 mg/dL (ref 0.50–0.99)
Globulin: 2.9 g/dL (calc) (ref 1.9–3.7)
Glucose, Bld: 67 mg/dL (ref 65–99)
Potassium: 4.3 mmol/L (ref 3.5–5.3)
Sodium: 138 mmol/L (ref 135–146)
Total Bilirubin: 0.3 mg/dL (ref 0.2–1.2)
Total Protein: 7 g/dL (ref 6.1–8.1)
eGFR: 108 mL/min/{1.73_m2} (ref >=60)

## 2021-01-04 LAB — LIPID PANEL
Cholesterol: 153 mg/dL (ref <200)
HDL: 53 mg/dL (ref >=50)
LDL Cholesterol (Calc): 76 mg/dL (calc)
Non-HDL Cholesterol (Calc): 100 mg/dL (calc) (ref <130)
Total CHOL/HDL Ratio: 2.9 (calc) (ref <5.0)
Triglycerides: 137 mg/dL (ref <150)

## 2021-01-04 MED ORDER — NA SULFATE-K SULFATE-MG SULF 17.5-3.13-1.6 GM/177ML PO SOLN
1.0000 | Freq: Once | ORAL | 0 refills | Status: AC
  Filled 2021-01-04 – 2021-01-17 (×2): qty 354, 1d supply, fill #0

## 2021-01-04 NOTE — Progress Notes (Signed)
Gastroenterology Pre-Procedure Review  Request Date: 01/18/21 Requesting Physician: Dr. Allen Norris  PATIENT REVIEW QUESTIONS: The patient responded to the following health history questions as indicated:    1. Are you having any GI issues? no 2. Do you have a personal history of Polyps? no 3. Do you have a family history of Colon Cancer or Polyps? yes (Father colon cancer) 4. Diabetes Mellitus? no 5. Joint replacements in the past 12 months?no 6. Major health problems in the past 3 months?no 7. Any artificial heart valves, MVP, or defibrillator?no    MEDICATIONS & ALLERGIES:    Patient reports the following regarding taking any anticoagulation/antiplatelet therapy:   Plavix, Coumadin, Eliquis, Xarelto, Lovenox, Pradaxa, Brilinta, or Effient? no Aspirin? no  Patient confirms/reports the following medications:  Current Outpatient Medications  Medication Sig Dispense Refill   B Complex-C (VITAMIN B + C COMPLEX) TABS Take 1 tablet by mouth daily. With zinc     hydrochlorothiazide (HYDRODIURIL) 12.5 MG tablet Take 1 tablet (12.5 mg total) by mouth daily. 30 tablet 0   ibuprofen (ADVIL) 600 MG tablet TAKE 1 TABLET BY MOUTH EVERY 6 HOURS AS NEEDED. 60 tablet 0   Multiple Vitamins-Minerals (WOMENS ONE DAILY PO) Take 1 tablet by mouth daily. Gummies     No current facility-administered medications for this visit.    Patient confirms/reports the following allergies:  Allergies  Allergen Reactions   Adhesive [Tape] Rash    Paper tape okay.  bandaids are a problem.    No orders of the defined types were placed in this encounter.   AUTHORIZATION INFORMATION Primary Insurance: 1D#: Group #:  Secondary Insurance: 1D#: Group #:  SCHEDULE INFORMATION: Date: 01/18/21 Time: Location: [place]

## 2021-01-07 ENCOUNTER — Encounter: Payer: Self-pay | Admitting: Gastroenterology

## 2021-01-14 ENCOUNTER — Ambulatory Visit: Payer: No Typology Code available for payment source | Admitting: Family Medicine

## 2021-01-17 ENCOUNTER — Ambulatory Visit (INDEPENDENT_AMBULATORY_CARE_PROVIDER_SITE_OTHER): Payer: No Typology Code available for payment source | Admitting: Nurse Practitioner

## 2021-01-17 ENCOUNTER — Encounter: Payer: Self-pay | Admitting: Nurse Practitioner

## 2021-01-17 ENCOUNTER — Other Ambulatory Visit: Payer: Self-pay

## 2021-01-17 VITALS — BP 118/72 | HR 89 | Temp 97.7°F | Resp 18 | Ht 75.0 in | Wt 249.4 lb

## 2021-01-17 DIAGNOSIS — R5383 Other fatigue: Secondary | ICD-10-CM

## 2021-01-17 DIAGNOSIS — I10 Essential (primary) hypertension: Secondary | ICD-10-CM

## 2021-01-17 MED ORDER — HYDROCHLOROTHIAZIDE 12.5 MG PO TABS
12.5000 mg | ORAL_TABLET | Freq: Every day | ORAL | 3 refills | Status: DC
  Filled 2021-01-17 – 2021-02-05 (×2): qty 90, 90d supply, fill #0
  Filled 2021-05-20: qty 90, 90d supply, fill #1
  Filled 2021-08-31: qty 90, 90d supply, fill #2

## 2021-01-17 NOTE — Progress Notes (Signed)
BP 118/72   Pulse 89   Temp 97.7 F (36.5 C) (Oral)   Resp 18   Ht _0  (1.905 m)   Wt 249 lb 6.4 oz (113.1 kg)   LMP 04/18/2019 (Approximate) Comment: 9 weeks ongoing menses  SpO2 98%   BMI 31.17 kg/m    Subjective:    Patient ID: Krista Garza, female    DOB: [birth year], 45 y.o.   MRN: [id number]  HPI: Krista Garza is a 45 y.o. female  Chief Complaint  Patient presents with   Follow-up    2 week recheck   HTN: She is here today to follow-up on blood pressure. Last visit 01/03/21, she reported that her blood pressure had been running high.  Her blood pressure was running 164/95,148/100 at her oncologist appointments.  She was having symptoms of headache and a band going across her eyes. Started hydrochlorothiazide 12.5 mg daily.  She has been taking it and feels much better.  Her blood pressure today is 118/72.  Her blood pressure readings at home were 117/79, 126/78.  She denies any chest pain, shortness of breath or dizziness.  She says that she no longer has headaches or a band going across her eyes.   Fatigue: She says that for several months she has been feeling fatigued.  She thought that with her infusions she would be feeling better but she has not noticed a difference.  She had a hysterectomy in 02/2020.  Will check labs today. She does request to have her estrogen checked. Her vitamin b12 was elevated, on 11/19/20.  Her anemia has improved with the Feraheme infusions. She is not on oral iron tablets.   Relevant past medical, surgical, family and social history reviewed and updated as indicated. Interim medical history since our last visit reviewed. Allergies and medications reviewed and updated.  Review of Systems  Constitutional: Negative for fever or weight change.  Respiratory: Negative for cough and shortness of breath.   Cardiovascular: Negative for chest pain or palpitations.  Gastrointestinal: Negative for abdominal pain, no bowel changes.   Musculoskeletal: Negative for gait problem or joint swelling.  Skin: Negative for rash.  Neurological: Negative for dizziness or headache.  No other specific complaints in a complete review of systems (except as listed in HPI above).      Objective:    BP 118/72   Pulse 89   Temp 97.7 F (36.5 C) (Oral)   Resp 18   Ht _1  (1.905 m)   Wt 249 lb 6.4 oz (113.1 kg)   LMP 04/18/2019 (Approximate) Comment: 9 weeks ongoing menses  SpO2 98%   BMI 31.17 kg/m   Wt Readings from Last 3 Encounters:  01/17/21 249 lb 6.4 oz (113.1 kg)  01/03/21 250 lb 11.2 oz (113.7 kg)  11/19/20 253 lb 4.8 oz (114.9 kg)    Physical Exam  Constitutional: Patient appears well-developed and well-nourished. No distress.  HEENT: head atraumatic, normocephalic, pupils equal and reactive to light, neck supple Cardiovascular: Normal rate, regular rhythm and normal heart sounds.  No murmur heard. No BLE edema. Pulmonary/Chest: Effort normal and breath sounds normal. No respiratory distress. Abdominal: Soft.  There is no tenderness. Psychiatric: Patient has a normal mood and affect. behavior is normal. Judgment and thought content normal.   Results for orders placed or performed in visit on 01/03/21  Lipid panel  Result Value Ref Range   Cholesterol 153 <200 mg/dL   HDL 53 > OR = 50 mg/dL  Triglycerides 137 <150 mg/dL   LDL Cholesterol (Calc) 76 mg/dL (calc)   Total CHOL/HDL Ratio 2.9 <5.0 (calc)   Non-HDL Cholesterol (Calc) 100 <130 mg/dL (calc)  COMPLETE METABOLIC PANEL WITH GFR  Result Value Ref Range   Glucose, Bld 67 65 - 99 mg/dL   BUN 12 7 - 25 mg/dL   Creat 0.67 0.50 - 0.99 mg/dL   eGFR 108 > OR = 60 mL/min/1.22m   BUN/Creatinine Ratio NOT APPLICABLE 6 - 22 (calc)   Sodium 138 135 - 146 mmol/L   Potassium 4.3 3.5 - 5.3 mmol/L   Chloride 101 98 - 110 mmol/L   CO2 29 20 - 32 mmol/L   Calcium 9.3 8.6 - 10.2 mg/dL   Total Protein 7.0 6.1 - 8.1 g/dL   Albumin 4.1 3.6 - 5.1 g/dL   Globulin  2.9 1.9 - 3.7 g/dL (calc)   AG Ratio 1.4 1.0 - 2.5 (calc)   Total Bilirubin 0.3 0.2 - 1.2 mg/dL   Alkaline phosphatase (APISO) 58 31 - 125 U/L   AST 12 10 - 35 U/L   ALT 10 6 - 29 U/L  Hemoglobin A1c  Result Value Ref Range   Hgb A1c MFr Bld 5.8 (H) <5.7 % of total Hgb   Mean Plasma Glucose 120 mg/dL   eAG (mmol/L) 6.6 mmol/L      Assessment & Plan:   1. Primary hypertension  - hydrochlorothiazide (HYDRODIURIL) 12.5 MG tablet; Take 1 tablet (12.5 mg total) by mouth daily.  Dispense: 90 tablet; Refill: 3  2. Fatigue, unspecified type  - Estrogens, total - VITAMIN D 25 Hydroxy (Vit-D Deficiency, Fractures) - TSH     Follow up plan: Return in about 6 months (around 07/18/2021) for follow up.

## 2021-01-18 ENCOUNTER — Ambulatory Visit
Admission: RE | Admit: 2021-01-18 | Discharge: 2021-01-18 | Disposition: A | Discharge status: Home / Self Care | Payer: No Typology Code available for payment source | Attending: Gastroenterology | Admitting: Gastroenterology

## 2021-01-18 ENCOUNTER — Encounter: Payer: Self-pay | Admitting: Gastroenterology

## 2021-01-18 ENCOUNTER — Ambulatory Visit: Payer: No Typology Code available for payment source | Admitting: Anesthesiology

## 2021-01-18 ENCOUNTER — Encounter
Admission: RE | Disposition: A | Discharge status: Home / Self Care | Payer: Self-pay | Source: Home / Self Care | Attending: Gastroenterology

## 2021-01-18 ENCOUNTER — Other Ambulatory Visit: Payer: Self-pay

## 2021-01-18 DIAGNOSIS — K648 Other hemorrhoids: Secondary | ICD-10-CM | POA: Diagnosis not present

## 2021-01-18 DIAGNOSIS — I10 Essential (primary) hypertension: Secondary | ICD-10-CM | POA: Diagnosis not present

## 2021-01-18 DIAGNOSIS — Z793 Long term (current) use of hormonal contraceptives: Secondary | ICD-10-CM | POA: Diagnosis not present

## 2021-01-18 DIAGNOSIS — Z1211 Encounter for screening for malignant neoplasm of colon: Secondary | ICD-10-CM | POA: Insufficient documentation

## 2021-01-18 DIAGNOSIS — Z91048 Other nonmedicinal substance allergy status: Secondary | ICD-10-CM | POA: Diagnosis not present

## 2021-01-18 DIAGNOSIS — Z79899 Other long term (current) drug therapy: Secondary | ICD-10-CM | POA: Insufficient documentation

## 2021-01-18 DIAGNOSIS — K5730 Diverticulosis of large intestine without perforation or abscess without bleeding: Secondary | ICD-10-CM | POA: Insufficient documentation

## 2021-01-18 DIAGNOSIS — Z800 Family history of malignant neoplasm of digestive organs: Secondary | ICD-10-CM

## 2021-01-18 HISTORY — DX: Prediabetes: R73.03

## 2021-01-18 HISTORY — DX: Essential (primary) hypertension: I10

## 2021-01-18 HISTORY — PX: COLONOSCOPY WITH PROPOFOL: SHX5780

## 2021-01-18 SURGERY — COLONOSCOPY WITH PROPOFOL
Anesthesia: General | Site: Rectum

## 2021-01-18 MED ORDER — ACETAMINOPHEN 160 MG/5ML PO SOLN: 325.0000 mg | Freq: Once | ORAL | Status: DC

## 2021-01-18 MED ORDER — ACETAMINOPHEN 325 MG PO TABS: 325.0000 mg | ORAL_TABLET | Freq: Once | ORAL | Status: DC

## 2021-01-18 MED ORDER — LACTATED RINGERS IV SOLN: INTRAVENOUS | Status: DC

## 2021-01-18 MED ORDER — PROPOFOL 10 MG/ML IV BOLUS
INTRAVENOUS | Status: DC | PRN
  Administered 2021-01-18: 80 mg via INTRAVENOUS
  Administered 2021-01-18 (×3): 30 mg via INTRAVENOUS

## 2021-01-18 MED ORDER — SODIUM CHLORIDE 0.9 % IV SOLN: INTRAVENOUS | Status: DC

## 2021-01-18 MED ORDER — LIDOCAINE HCL (CARDIAC) PF 100 MG/5ML IV SOSY
PREFILLED_SYRINGE | INTRAVENOUS | Status: DC | PRN
  Administered 2021-01-18: 40 mg via INTRAVENOUS

## 2021-01-18 MED ORDER — STERILE WATER FOR IRRIGATION IR SOLN
Status: DC | PRN
  Administered 2021-01-18: 1

## 2021-01-18 SURGICAL SUPPLY — 6 items
GOWN CVR UNV OPN BCK APRN NK (MISCELLANEOUS) ×2 IMPLANT
GOWN ISOL THUMB LOOP REG UNIV (MISCELLANEOUS) ×4
KIT PRC NS LF DISP ENDO (KITS) ×1 IMPLANT
KIT PROCEDURE OLYMPUS (KITS) ×2
MANIFOLD NEPTUNE II (INSTRUMENTS) ×2 IMPLANT
WATER STERILE IRR 250ML POUR (IV SOLUTION) ×2 IMPLANT

## 2021-01-18 NOTE — Anesthesia Procedure Notes (Signed)
Date/Time: 01/18/2021 11:17 AM Performed by: Dionne Bucy, CRNA Pre-anesthesia Checklist: Patient identified, Emergency Drugs available, Suction available, Patient being monitored and Timeout performed Patient Re-evaluated:Patient Re-evaluated prior to induction Oxygen Delivery Method: Nasal cannula Induction Type: IV induction Placement Confirmation: positive ETCO2

## 2021-01-18 NOTE — Transfer of Care (Signed)
Immediate Anesthesia Transfer of Care Note  Patient: Krista Garza  Procedure(s) Performed: COLONOSCOPY WITH PROPOFOL (Rectum)  Patient Location: PACU  Anesthesia Type: General  Level of Consciousness: awake, alert  and patient cooperative  Airway and Oxygen Therapy: Patient Spontanous Breathing and Patient connected to supplemental oxygen  Post-op Assessment: Post-op Vital signs reviewed, Patient's Cardiovascular Status Stable, Respiratory Function Stable, Patent Airway and No signs of Nausea or vomiting  Post-op Vital Signs: Reviewed and stable  Complications: No notable events documented.

## 2021-01-18 NOTE — Anesthesia Postprocedure Evaluation (Signed)
Anesthesia Post Note  Patient: Krista Garza  Procedure(s) Performed: COLONOSCOPY WITH PROPOFOL (Rectum)     Patient location during evaluation: PACU Anesthesia Type: General Level of consciousness: awake and alert and oriented Pain management: satisfactory to patient Vital Signs Assessment: post-procedure vital signs reviewed and stable Respiratory status: spontaneous breathing, nonlabored ventilation and respiratory function stable Cardiovascular status: blood pressure returned to baseline and stable Postop Assessment: Adequate PO intake and No signs of nausea or vomiting Anesthetic complications: no   No notable events documented.  Raliegh Ip

## 2021-01-18 NOTE — Anesthesia Preprocedure Evaluation (Signed)
Anesthesia Evaluation  Patient identified by MRN, date of birth, ID band Patient awake    Reviewed: Allergy & Precautions, H&P , NPO status , Patient's Chart, lab work & pertinent test results  Airway Mallampati: II  TM Distance: >3 FB Neck ROM: full    Dental no notable dental hx.    Pulmonary    Pulmonary exam normal breath sounds clear to auscultation       Cardiovascular hypertension, Normal cardiovascular exam Rhythm:regular Rate:Normal     Neuro/Psych    GI/Hepatic   Endo/Other    Renal/GU      Musculoskeletal   Abdominal   Peds  Hematology   Anesthesia Other Findings   Reproductive/Obstetrics                             Anesthesia Physical Anesthesia Plan  ASA: 2  Anesthesia Plan: General   Post-op Pain Management:    Induction: Intravenous  PONV Risk Score and Plan: 3 and Treatment may vary due to age or medical condition, TIVA and Propofol infusion  Airway Management Planned: Natural Airway  Additional Equipment:   Intra-op Plan:   Post-operative Plan:   Informed Consent: I have reviewed the patients History and Physical, chart, labs and discussed the procedure including the risks, benefits and alternatives for the proposed anesthesia with the patient or authorized representative who has indicated his/her understanding and acceptance.     Dental Advisory Given  Plan Discussed with: CRNA  Anesthesia Plan Comments:         Anesthesia Quick Evaluation

## 2021-01-18 NOTE — Op Note (Signed)
[facility] Gastroenterology Patient Name: Krista Garza Procedure Date: 01/18/2021 11:13 AM MRN: [id number] Account #: [id number] Date of Birth: 06-13-1975 Admit Type: Outpatient Age: 45 Room: [facility] OR ROOM 01 Gender: Female Note Status: Finalized Instrument Name: [id number] Procedure:             Colonoscopy Indications:           Screening for colorectal malignant neoplasm Providers:             Lucilla Lame MD, MD Referring MD:          Delsa Grana (Referring MD) Medicines:             Propofol per Anesthesia Complications:         No immediate complications. Procedure:             Pre-Anesthesia Assessment:                        - Prior to the procedure, a History and Physical was                         performed, and patient medications and allergies were                         reviewed. The patient's tolerance of previous                         anesthesia was also reviewed. The risks and benefits                         of the procedure and the sedation options and risks                         were discussed with the patient. All questions were                         answered, and informed consent was obtained. Prior                         Anticoagulants: The patient has taken no previous                         anticoagulant or antiplatelet agents. ASA Grade                         Assessment: II - A patient with mild systemic disease.                         After reviewing the risks and benefits, the patient                         was deemed in satisfactory condition to undergo the                         procedure.                        After obtaining informed consent, the colonoscope was  passed under direct vision. Throughout the procedure,                         the patient's blood pressure, pulse, and oxygen                         saturations were monitored continuously. The                         Colonoscope was  introduced through the anus and                         advanced to the the cecum, identified by appendiceal                         orifice and ileocecal valve. The colonoscopy was                         performed without difficulty. The patient tolerated                         the procedure well. The quality of the bowel                         preparation was excellent. Findings:      The perianal and digital rectal examinations were normal.      A few small-mouthed diverticula were found in the sigmoid colon.      Non-bleeding internal hemorrhoids were found during retroflexion. The       hemorrhoids were Grade I (internal hemorrhoids that do not prolapse). Impression:            - Diverticulosis in the sigmoid colon.                        - Non-bleeding internal hemorrhoids.                        - No specimens collected. Recommendation:        - Discharge patient to home.                        - Resume previous diet.                        - Continue present medications.                        - Repeat colonoscopy in 10 years for screening                         purposes. Procedure Code(s):     --- Professional ---                        [id number], Colonoscopy, flexible; diagnostic, including                         collection of specimen(s) by brushing or washing, when                         performed (separate procedure)  Diagnosis Code(s):     --- Professional ---                        Z12.11, Encounter for screening for malignant neoplasm                         of colon CPT copyright 2019 American Medical Association. All rights reserved. The codes documented in this report are preliminary and upon coder review may  be revised to meet current compliance requirements. Lucilla Lame MD, MD 01/18/2021 11:33:49 AM This report has been signed electronically. Number of Addenda: 0 Note Initiated On: 01/18/2021 11:13 AM Scope Withdrawal Time: 0 hours 6 minutes 17 seconds  Total  Procedure Duration: 0 hours 9 minutes 12 seconds  Estimated Blood Loss:  Estimated blood loss: none.      [facility]

## 2021-01-18 NOTE — H&P (Signed)
Lucilla Lame, MD [address] [place], [place] [address] Phone: [phone] Fax : [phone]  Primary Care Physician:  Delsa Grana, PA-C Primary Gastroenterologist:  Dr. Allen Norris  Pre-Procedure History & Physical: HPI:  Krista Garza is a 45 y.o. female is here for a screening colonoscopy.   Past Medical History:  Diagnosis Date   Anemia    IRON DEFICIENCY. REQUIRED FERAHEME INFUSIONS.   Chest pain, unspecified    Hypertension    Pre-diabetes     Past Surgical History:  Procedure Laterality Date   CESAREAN SECTION     x2   CYSTOSCOPY N/A 03/13/2020   Procedure: CYSTOSCOPY;  Surgeon: Homero Fellers, MD;  Location: [facility] ORS;  Service: Gynecology;  Laterality: N/A;   DILATION AND CURETTAGE OF UTERUS     HYSTEROSCOPY WITH D & C N/A 06/21/2019   Procedure: DILATATION AND CURETTAGE /HYSTEROSCOPY, Polypectomy with myosure;  Surgeon: Homero Fellers, MD;  Location: [facility] ORS;  Service: Gynecology;  Laterality: N/A;   TOTAL LAPAROSCOPIC HYSTERECTOMY WITH SALPINGECTOMY Bilateral 03/13/2020   Procedure: TOTAL LAPAROSCOPIC HYSTERECTOMY WITH BILATERAL SALPINGECTOMY;  Surgeon: Homero Fellers, MD;  Location: [facility] ORS;  Service: Gynecology;  Laterality: Bilateral;    Prior to Admission medications   Medication Sig Start Date End Date Taking? Authorizing Provider  B Complex-C (VITAMIN B + C COMPLEX) TABS Take 1 tablet by mouth daily. With zinc   Yes [provider]  hydrochlorothiazide (HYDRODIURIL) 12.5 MG tablet Take 1 tablet (12.5 mg total) by mouth daily. 01/17/21  Yes Bo Merino, FNP  Multiple Vitamins-Minerals (WOMENS ONE DAILY PO) Take 1 tablet by mouth daily. Gummies   Yes [provider]  Na Sulfate-K Sulfate-Mg Sulf 17.5-3.13-1.6 GM/177ML SOLN Take 1 kit by mouth once for 1 dose. 01/04/21 01/18/21  Lucilla Lame, MD  medroxyPROGESTERone (PROVERA) 10 MG tablet Take 2 tablets (20 mg total) by mouth in the morning and at  bedtime. Take 20 mg twice a day for ten days. Then take 10 mg once a day. Patient taking differently: Take 10-20 mg by mouth daily. 02/20/20 03/13/20  Homero Fellers, MD    Allergies as of 01/04/2021 - Review Complete 01/03/2021  Allergen Reaction Noted   Adhesive [tape] Rash 06/16/2019    Family History  Problem Relation Age of Onset   Diabetes Father    Hyperlipidemia Father    Hypertension Father    Cancer Father 33       colon   Colon cancer Father     Social History   Socioeconomic History   Marital status: Divorced    Spouse name: Not on file   Number of children: 4   Years of education: 12   Highest education level: Associate degree: occupational, Hotel manager, or vocational program  Occupational History   Occupation: Full time    Comment: security  Tobacco Use   Smoking status: Never   Smokeless tobacco: Never  Vaping Use   Vaping Use: Never used  Substance and Sexual Activity   Alcohol use: Yes    Comment: margaritas occasionally   Drug use: No   Sexual activity: Not Currently    Birth control/protection: Injection  Other Topics Concern   Not on file  Social History Narrative   Divorced   Does not get regular exercise   Lives with children.    Feels safe in her home.   Works as Land at [employer].   Social Determinants of Radio broadcast assistant Strain:  Not on file  Food Insecurity: Not on file  Transportation Needs: Not on file  Physical Activity: Not on file  Stress: Not on file  Social Connections: Not on file  Intimate Partner Violence: Not on file    Review of Systems: See HPI, otherwise negative ROS  Physical Exam: BP (!) 135/95   Pulse 63   Temp 98.4 F (36.9 C) (Temporal)   Resp 16   Ht 6' 3" (1.905 m)   Wt 111.4 kg   LMP 04/18/2019 (Approximate) Comment: 9 weeks ongoing menses  SpO2 97%   BMI 30.69 kg/m  General:   Alert,  pleasant and cooperative in NAD Head:  Normocephalic and atraumatic. Neck:  Supple; no  masses or thyromegaly. Lungs:  Clear throughout to auscultation.    Heart:  Regular rate and rhythm. Abdomen:  Soft, nontender and nondistended. Normal bowel sounds, without guarding, and without rebound.   Neurologic:  Alert and  oriented x4;  grossly normal neurologically.  Impression/Plan: Krista Garza is now here to undergo a screening colonoscopy.  Risks, benefits, and alternatives regarding colonoscopy have been reviewed with the patient.  Questions have been answered.  All parties agreeable.

## 2021-01-21 ENCOUNTER — Encounter: Payer: Self-pay | Admitting: Gastroenterology

## 2021-01-22 ENCOUNTER — Ambulatory Visit: Payer: No Typology Code available for payment source | Admitting: Family Medicine

## 2021-01-22 DIAGNOSIS — G4730 Sleep apnea, unspecified: Secondary | ICD-10-CM

## 2021-01-22 HISTORY — DX: Sleep apnea, unspecified: G47.30

## 2021-01-23 LAB — ESTROGENS, TOTAL: Estrogen: 528.4 pg/mL

## 2021-01-23 LAB — VITAMIN D 25 HYDROXY (VIT D DEFICIENCY, FRACTURES): Vit D, 25-Hydroxy: 36 ng/mL (ref 30–100)

## 2021-01-23 LAB — TSH: TSH: 0.9 mIU/L

## 2021-02-05 ENCOUNTER — Other Ambulatory Visit: Payer: Self-pay

## 2021-02-05 ENCOUNTER — Encounter: Payer: Self-pay | Admitting: Adult Health

## 2021-02-05 ENCOUNTER — Ambulatory Visit (INDEPENDENT_AMBULATORY_CARE_PROVIDER_SITE_OTHER): Payer: No Typology Code available for payment source | Admitting: Adult Health

## 2021-02-05 VITALS — BP 122/80 | HR 70 | Ht 74.5 in | Wt 254.6 lb

## 2021-02-05 DIAGNOSIS — R0683 Snoring: Secondary | ICD-10-CM | POA: Diagnosis not present

## 2021-02-05 DIAGNOSIS — G4719 Other hypersomnia: Secondary | ICD-10-CM | POA: Insufficient documentation

## 2021-02-05 NOTE — Assessment & Plan Note (Addendum)
Excessive daytime sleepiness, snoring, fatigue, witnessed apneic events , and obesity all are all suspicious for underlying sleep apnea.  Patient will need to proceed with a home sleep study.  Patient education was given  - discussed how weight can impact sleep and risk for sleep disordered breathing - discussed options to assist with weight loss: combination of diet modification, cardiovascular and strength training exercises   - had an extensive discussion regarding the adverse health consequences related to untreated sleep disordered breathing - specifically discussed the risks for hypertension, coronary artery disease, cardiac dysrhythmias, cerebrovascular disease, and diabetes - lifestyle modification discussed   - discussed how sleep disruption can increase risk of accidents, particularly when driving - safe driving practices were discussed    Plan  Patient Instructions  Set up for Home sleep study.  Healthy sleep regimen .  Work on healthy weight loss.  Follow up in 6 weeks to review results.

## 2021-02-05 NOTE — Progress Notes (Signed)
@Patient  ID: Krista Garza, female    DOB: July 03, 1975, 45 y.o.   MRN: [id number]  Chief Complaint  Patient presents with   Consult    Referring provider: Delsa Grana, Hershal Coria  HPI: 45 yo female presents for sleep consult February 05, 2021 for daytime sleepiness and snoring Works security at [employer]   TEST/EVENTS :   02/05/2021 Sleep Consult  Patient presents for a sleep consult.  Patient was referred by her primary care provider.  Patient's been having daytime sleepiness, restless sleep and loud snoring for a long time now. Family is concerned she has sleep apnea.   She is been having ongoing fatigue and low energy. Family says she snores loudly and stops breathing at times. Has significant daytime sleepiness, can fall asleep easily if she is still. Epworth score is 24 .  Says she can go to sleep very easily but wakes up multiple times throughout the night.  Typically goes to bed around 9 -10 pm .  and gets up depending on her work shift at 4 AM or 7 AM. Works first shift mostly but picks up extra shifts a lot .  Average caffeine intake is 1-2 cup of coffee. No soda. 1 energy drink if works night shift. Picks up extra shifts a lot.  Denies any suspicious symptoms for cataplexy or sleep paralysis. Medical history significant for hypertension. Will take 1 hr naps if able.  Current weight is 254 lbs, hard to lose weight.  Does not watch much TV at night .     Allergies  Allergen Reactions   Adhesive [Tape] Rash    Paper tape okay.  bandaids are a problem.   Mango Flavor Rash   Passion Fruit Flavor Rash   Pineapple Rash    Immunization History  Administered Date(s) Administered   Influenza, Seasonal, Injecte, Preservative Fre 01/07/2008   Influenza,inj,Quad PF,6+ Mos 12/25/2020   Influenza-Unspecified 01/06/2020   PFIZER(Purple Top)SARS-COV-2 Vaccination 04/11/2019, 05/03/2019, 02/03/2020   Tdap 03/10/2010    Past Medical History:  Diagnosis Date   Anemia    IRON  DEFICIENCY. REQUIRED FERAHEME INFUSIONS.   Chest pain, unspecified    Hypertension    Pre-diabetes     Tobacco History: Social History   Tobacco Use  Smoking Status Never  Smokeless Tobacco Never   Counseling given: Not Answered  SH : Works in Land , single . 4 kids (3 adult and 22yr old) .  Never smoker. Social alcohol .   FH :  Parents have OSA  HTN   Surgical :  Hysterectomy 2021   Outpatient Medications Prior to Visit  Medication Sig Dispense Refill   B Complex-C (VITAMIN B + C COMPLEX) TABS Take 1 tablet by mouth daily. With zinc     hydrochlorothiazide (HYDRODIURIL) 12.5 MG tablet Take 1 tablet (12.5 mg total) by mouth daily. 90 tablet 3   Multiple Vitamins-Minerals (WOMENS ONE DAILY PO) Take 1 tablet by mouth daily. Gummies     No facility-administered medications prior to visit.     Review of Systems:   Constitutional:   No  weight loss, night sweats,  Fevers, chills,  +fatigue, or  lassitude.  HEENT:   No headaches,  Difficulty swallowing,  Tooth/dental problems, or  Sore throat,                No sneezing, itching, ear ache, nasal congestion, post nasal drip,   CV:  No chest pain,  Orthopnea, PND, swelling in lower extremities, anasarca, dizziness,  palpitations, syncope.   GI  No heartburn, indigestion, abdominal pain, nausea, vomiting, diarrhea, change in bowel habits, loss of appetite, bloody stools.   Resp: No shortness of breath with exertion or at rest.  No excess mucus, no productive cough,  No non-productive cough,  No coughing up of blood.  No change in color of mucus.  No wheezing.  No chest wall deformity  Skin: no rash or lesions.  GU: no dysuria, change in color of urine, no urgency or frequency.  No flank pain, no hematuria   MS:  No joint pain or swelling.  No decreased range of motion.  No back pain.    Physical Exam  BP 122/80 (BP Location: Left Arm, Patient Position: Sitting, Cuff Size: Large)   Pulse 70   Ht 6' 2.5" (1.892  m)   Wt 254 lb 9.6 oz (115.5 kg)   LMP 04/18/2019 (Approximate) Comment: 9 weeks ongoing menses  SpO2 98%   BMI 32.25 kg/m   GEN: A/Ox3; pleasant , NAD, well nourished    HEENT:  [place]/AT,  NOSE-clear, THROAT-clear, no lesions, no postnasal drip or exudate noted. Class 2-3 MP airway   NECK:  Supple w/ fair ROM; no JVD; normal carotid impulses w/o bruits; no thyromegaly or nodules palpated; no lymphadenopathy.    RESP  Clear  P & A; w/o, wheezes/ rales/ or rhonchi. no accessory muscle use, no dullness to percussion  CARD:  RRR, no m/r/g, no peripheral edema, pulses intact, no cyanosis or clubbing.  GI:   Soft & nt; nml bowel sounds; no organomegaly or masses detected.   Musco: Warm bil, no deformities or joint swelling noted.   Neuro: alert, no focal deficits noted.    Skin: Warm, no lesions or rashes    Lab Results:     BNP No results found for: BNP  ProBNP No results found for: PROBNP  Imaging: No results found.    No flowsheet data found.  No results found for: NITRICOXIDE      Assessment & Plan:   Excessive daytime sleepiness Excessive daytime sleepiness, snoring, fatigue, obesity all are all suspicious for underlying sleep apnea.  Patient will need to proceed with a home sleep study.  Patient education was given  - discussed how weight can impact sleep and risk for sleep disordered breathing - discussed options to assist with weight loss: combination of diet modification, cardiovascular and strength training exercises   - had an extensive discussion regarding the adverse health consequences related to untreated sleep disordered breathing - specifically discussed the risks for hypertension, coronary artery disease, cardiac dysrhythmias, cerebrovascular disease, and diabetes - lifestyle modification discussed   - discussed how sleep disruption can increase risk of accidents, particularly when driving - safe driving practices were discussed    Plan   Patient Instructions  Set up for Home sleep study.  Healthy sleep regimen .  Work on healthy weight loss.  Follow up in 6 weeks to review results.         Rexene Edison, NP 02/05/2021

## 2021-02-05 NOTE — Patient Instructions (Signed)
Set up for Home sleep study.  Healthy sleep regimen .  Work on healthy weight loss.  Follow up in 6 weeks to review results.

## 2021-02-05 NOTE — Progress Notes (Signed)
Reviewed and agree with assessment/plan.   Chesley Mires, MD [facility] Pulmonary/Critical Care 02/05/2021, 4:52 PM Pager:  [phone]

## 2021-04-09 ENCOUNTER — Ambulatory Visit: Payer: No Typology Code available for payment source

## 2021-04-09 ENCOUNTER — Other Ambulatory Visit: Payer: Self-pay

## 2021-04-09 DIAGNOSIS — R0683 Snoring: Secondary | ICD-10-CM

## 2021-04-09 DIAGNOSIS — G4733 Obstructive sleep apnea (adult) (pediatric): Secondary | ICD-10-CM | POA: Diagnosis not present

## 2021-04-16 DIAGNOSIS — G4733 Obstructive sleep apnea (adult) (pediatric): Secondary | ICD-10-CM

## 2021-04-18 NOTE — Telephone Encounter (Signed)
Patient is requesting HST results.   Tammy, please advise. Thanks

## 2021-04-18 NOTE — Telephone Encounter (Signed)
HST on 04/09/21 shows mild OSA with AHI at 12.4/hr, and spo2 at 83%  Will need to set up OV , in person or video to discuss results and treatment plan .

## 2021-04-19 ENCOUNTER — Telehealth (INDEPENDENT_AMBULATORY_CARE_PROVIDER_SITE_OTHER): Payer: No Typology Code available for payment source | Admitting: Adult Health

## 2021-04-19 ENCOUNTER — Other Ambulatory Visit: Payer: Self-pay

## 2021-04-19 ENCOUNTER — Encounter: Payer: Self-pay | Admitting: Adult Health

## 2021-04-19 DIAGNOSIS — G4733 Obstructive sleep apnea (adult) (pediatric): Secondary | ICD-10-CM | POA: Diagnosis not present

## 2021-04-19 DIAGNOSIS — G4719 Other hypersomnia: Secondary | ICD-10-CM | POA: Diagnosis not present

## 2021-04-19 DIAGNOSIS — R0683 Snoring: Secondary | ICD-10-CM

## 2021-04-19 NOTE — Progress Notes (Signed)
Virtual Visit via Video Note  I connected with [name] on 04/19/21 at  2:30 PM EST by a video enabled telemedicine application and verified that I am speaking with the correct person using two identifiers.  Location: Patient: Work which Provider: Office    I discussed the limitations of evaluation and management by telemedicine and the availability of in person appointments. The patient expressed understanding and agreed to proceed.  History of Present Illness: 46 year old female seen for sleep consult February 05, 2021 for daytime sleepiness snoring and restless sleep.  She was set up for home sleep study that was done on April 09, 2021 that showed mild obstructive sleep apnea with AHI 12.4/hour and SPO2 low at 83%.  We discussed in detail her sleep study results.  Went over treatment options including weight loss, oral appliance and CPAP.  Patient would like to proceed with oral appliance.  Patient education was given on healthy sleep regimen.  Past Medical History:  Diagnosis Date   Anemia    IRON DEFICIENCY. REQUIRED FERAHEME INFUSIONS.   Chest pain, unspecified    Hypertension    Pre-diabetes    Current Outpatient Medications on File Prior to Visit  Medication Sig Dispense Refill   B Complex-C (VITAMIN B + C COMPLEX) TABS Take 1 tablet by mouth daily. With zinc     hydrochlorothiazide (HYDRODIURIL) 12.5 MG tablet Take 1 tablet (12.5 mg total) by mouth daily. 90 tablet 3   Multiple Vitamins-Minerals (WOMENS ONE DAILY PO) Take 1 tablet by mouth daily. Gummies     [DISCONTINUED] medroxyPROGESTERone (PROVERA) 10 MG tablet Take 2 tablets (20 mg total) by mouth in the morning and at bedtime. Take 20 mg twice a day for ten days. Then take 10 mg once a day. (Patient taking differently: Take 10-20 mg by mouth daily.) 80 tablet 0   No current facility-administered medications on file prior to visit.      Observations/Objective: Appears well in no acute distress  Assessment and  Plan: Mild obstructive sleep apnea-patient education given - discussed how weight can impact sleep and risk for sleep disordered breathing - discussed options to assist with weight loss: combination of diet modification, cardiovascular and strength training exercises   - had an extensive discussion regarding the adverse health consequences related to untreated sleep disordered breathing - specifically discussed the risks for hypertension, coronary artery disease, cardiac dysrhythmias, cerebrovascular disease, and diabetes - lifestyle modification discussed   - discussed how sleep disruption can increase risk of accidents, particularly when driving - safe driving practices were discussed   Plan  Patient Instructions  Referral for oral appliance: Dr. Ron Parker [phone]) Healthy sleep regimen .  Work on healthy weight loss.  Follow up in 6 months and As needed        Follow Up Instructions:    I discussed the assessment and treatment plan with the patient. The patient was provided an opportunity to ask questions and all were answered. The patient agreed with the plan and demonstrated an understanding of the instructions.   The patient was advised to call back or seek an in-person evaluation if the symptoms worsen or if the condition fails to improve as anticipated.  I provided 20  minutes of non-face-to-face time during this encounter.   Rexene Edison, NP

## 2021-04-19 NOTE — Patient Instructions (Signed)
Referral for oral appliance: Dr. Ron Parker [phone]) Healthy sleep regimen .  Work on healthy weight loss.  Follow up in 6 months and As needed

## 2021-05-14 ENCOUNTER — Other Ambulatory Visit: Payer: Self-pay | Admitting: *Deleted

## 2021-05-14 DIAGNOSIS — D500 Iron deficiency anemia secondary to blood loss (chronic): Secondary | ICD-10-CM

## 2021-05-20 ENCOUNTER — Other Ambulatory Visit: Payer: Self-pay

## 2021-05-21 ENCOUNTER — Encounter: Payer: Self-pay | Admitting: Nurse Practitioner

## 2021-05-21 ENCOUNTER — Inpatient Hospital Stay: Payer: No Typology Code available for payment source | Attending: Nurse Practitioner

## 2021-05-21 ENCOUNTER — Other Ambulatory Visit: Payer: Self-pay

## 2021-05-21 ENCOUNTER — Inpatient Hospital Stay (HOSPITAL_BASED_OUTPATIENT_CLINIC_OR_DEPARTMENT_OTHER): Payer: No Typology Code available for payment source | Admitting: Nurse Practitioner

## 2021-05-21 VITALS — BP 125/79 | HR 84 | Temp 95.0°F | Resp 18 | Wt 263.0 lb

## 2021-05-21 DIAGNOSIS — Z9079 Acquired absence of other genital organ(s): Secondary | ICD-10-CM | POA: Insufficient documentation

## 2021-05-21 DIAGNOSIS — Z888 Allergy status to other drugs, medicaments and biological substances status: Secondary | ICD-10-CM | POA: Insufficient documentation

## 2021-05-21 DIAGNOSIS — D509 Iron deficiency anemia, unspecified: Secondary | ICD-10-CM

## 2021-05-21 DIAGNOSIS — D75839 Thrombocytosis, unspecified: Secondary | ICD-10-CM | POA: Insufficient documentation

## 2021-05-21 DIAGNOSIS — Z8349 Family history of other endocrine, nutritional and metabolic diseases: Secondary | ICD-10-CM | POA: Insufficient documentation

## 2021-05-21 DIAGNOSIS — Z8249 Family history of ischemic heart disease and other diseases of the circulatory system: Secondary | ICD-10-CM | POA: Insufficient documentation

## 2021-05-21 DIAGNOSIS — Z800 Family history of malignant neoplasm of digestive organs: Secondary | ICD-10-CM | POA: Diagnosis not present

## 2021-05-21 DIAGNOSIS — D582 Other hemoglobinopathies: Secondary | ICD-10-CM | POA: Insufficient documentation

## 2021-05-21 DIAGNOSIS — Z79899 Other long term (current) drug therapy: Secondary | ICD-10-CM | POA: Insufficient documentation

## 2021-05-21 DIAGNOSIS — Z833 Family history of diabetes mellitus: Secondary | ICD-10-CM | POA: Diagnosis not present

## 2021-05-21 DIAGNOSIS — D500 Iron deficiency anemia secondary to blood loss (chronic): Secondary | ICD-10-CM | POA: Diagnosis present

## 2021-05-21 DIAGNOSIS — N920 Excessive and frequent menstruation with regular cycle: Secondary | ICD-10-CM | POA: Diagnosis not present

## 2021-05-21 LAB — IRON AND TIBC
Iron: 56 ug/dL (ref 28–170)
Saturation Ratios: 16 % (ref 10.4–31.8)
TIBC: 344 ug/dL (ref 250–450)
UIBC: 288 ug/dL

## 2021-05-21 LAB — CBC WITH DIFFERENTIAL/PLATELET
Abs Immature Granulocytes: 0.02 10*3/uL (ref 0.00–0.07)
Basophils Absolute: 0.1 10*3/uL (ref 0.0–0.1)
Basophils Relative: 1 %
Eosinophils Absolute: 0.2 10*3/uL (ref 0.0–0.5)
Eosinophils Relative: 3 %
HCT: 47.9 % — ABNORMAL HIGH (ref 36.0–46.0)
Hemoglobin: 15.7 g/dL — ABNORMAL HIGH (ref 12.0–15.0)
Immature Granulocytes: 0 %
Lymphocytes Relative: 23 %
Lymphs Abs: 1.8 10*3/uL (ref 0.7–4.0)
MCH: 28.6 pg (ref 26.0–34.0)
MCHC: 32.8 g/dL (ref 30.0–36.0)
MCV: 87.4 fL (ref 80.0–100.0)
Monocytes Absolute: 0.7 10*3/uL (ref 0.1–1.0)
Monocytes Relative: 10 %
Neutro Abs: 4.8 10*3/uL (ref 1.7–7.7)
Neutrophils Relative %: 63 %
Platelets: 264 10*3/uL (ref 150–400)
RBC: 5.48 MIL/uL — ABNORMAL HIGH (ref 3.87–5.11)
RDW: 12.5 % (ref 11.5–15.5)
WBC: 7.5 10*3/uL (ref 4.0–10.5)
nRBC: 0 % (ref 0.0–0.2)

## 2021-05-21 LAB — FERRITIN: Ferritin: 53 ng/mL (ref 11–307)

## 2021-05-21 NOTE — Progress Notes (Signed)
[facility]  [phone] [phone]) [phone]  ID: Krista Garza OB: [birth year]  MR#: [id number]  YTK#:[id number]  Patient Care Team: Delsa Grana, PA-C as PCP - General (Family Medicine) Lloyd Huger, MD as Consulting Physician (Oncology)  CHIEF COMPLAINT: Iron deficiency anemia.  HPI: Krista Garza is a 46 year old female with past medical history significant for endometrial polyp, abnormal uterine bleeding, menorrhagia, endometriosis and IDA who is followed by Dr. Grayland Ormond and has received intermittent IV iron.  Krista Garza last received IV Feraheme on 03/20/2020 and 03/27/2020.  Krista Garza had a vaginal hysterectomy on 03/13/2020 due to heavy abnormal uterine bleeding and tolerated well.  Vaginal bleeding has essentially stopped.  INTERVAL HISTORY: Patient is 46 year old female with above history of iron deficiency anemia who returns to clinic for follow-up.      REVIEW OF SYSTEMS:   Review of Systems  Constitutional: Negative.  Negative for chills, fever, malaise/fatigue and weight loss.  HENT:  Negative for congestion, ear pain and tinnitus.   Eyes: Negative.  Negative for blurred vision and double vision.  Respiratory: Negative.  Negative for cough, sputum production and shortness of breath.   Cardiovascular: Negative.  Negative for chest pain, palpitations and leg swelling.  Gastrointestinal: Negative.  Negative for abdominal pain, constipation, diarrhea, nausea and vomiting.  Genitourinary:  Negative for dysuria, frequency and urgency.  Musculoskeletal:  Negative for back pain and falls.  Skin: Negative.  Negative for rash.  Neurological: Negative.  Negative for weakness and headaches.  Endo/Heme/Allergies: Negative.  Does not bruise/bleed easily.  Psychiatric/Behavioral: Negative.  Negative for depression. The patient is not nervous/anxious and does not have insomnia.   As per HPI. Otherwise, a complete review of systems is negative.  PAST MEDICAL  HISTORY: Past Medical History:  Diagnosis Date   Anemia    IRON DEFICIENCY. REQUIRED FERAHEME INFUSIONS.   Chest pain, unspecified    Hypertension    Pre-diabetes     PAST SURGICAL HISTORY: Past Surgical History:  Procedure Laterality Date   CESAREAN SECTION     x2   COLONOSCOPY WITH PROPOFOL N/A 01/18/2021   Procedure: COLONOSCOPY WITH PROPOFOL;  Surgeon: Lucilla Lame, MD;  Location: [facility];  Service: Endoscopy;  Laterality: N/A;   CYSTOSCOPY N/A 03/13/2020   Procedure: CYSTOSCOPY;  Surgeon: Homero Fellers, MD;  Location: [facility];  Service: Gynecology;  Laterality: N/A;   DILATION AND CURETTAGE OF UTERUS     HYSTEROSCOPY WITH D & C N/A 06/21/2019   Procedure: DILATATION AND CURETTAGE /HYSTEROSCOPY, Polypectomy with myosure;  Surgeon: Homero Fellers, MD;  Location: [facility];  Service: Gynecology;  Laterality: N/A;   TOTAL LAPAROSCOPIC HYSTERECTOMY WITH SALPINGECTOMY Bilateral 03/13/2020   Procedure: TOTAL LAPAROSCOPIC HYSTERECTOMY WITH BILATERAL SALPINGECTOMY;  Surgeon: Homero Fellers, MD;  Location: [facility];  Service: Gynecology;  Laterality: Bilateral;    FAMILY HISTORY: Family History  Problem Relation Age of Onset   Diabetes Father    Hyperlipidemia Father    Hypertension Father    Cancer Father 96       colon   Colon cancer Father     ADVANCED DIRECTIVES (Y/N):  N  HEALTH MAINTENANCE: Social History   Tobacco Use   Smoking status: Never   Smokeless tobacco: Never  Vaping Use   Vaping Use: Never used  Substance Use Topics   Alcohol use: Yes    Comment: margaritas occasionally   Drug use: No     Colonoscopy:  PAP:  Bone density:  Lipid panel:  Allergies  Allergen Reactions   Adhesive [Tape] Rash    Paper tape okay.  bandaids are a problem.   Mango Flavor Rash   Passion Fruit Flavor Rash   Pineapple Rash    Current Outpatient Medications  Medication Sig Dispense Refill   B Complex-C (VITAMIN B + C COMPLEX) TABS  Take 1 tablet by mouth daily. With zinc     hydrochlorothiazide (HYDRODIURIL) 12.5 MG tablet Take 1 tablet (12.5 mg total) by mouth daily. 90 tablet 3   Multiple Vitamins-Minerals (WOMENS ONE DAILY PO) Take 1 tablet by mouth daily. Gummies     No current facility-administered medications for this visit.    OBJECTIVE: There were no vitals filed for this visit.    There is no height or weight on file to calculate BMI.    ECOG FS:0 - Asymptomatic  Physical Exam Constitutional:      General: Krista Garza is not in acute distress.    Appearance: Krista Garza is well-developed. Krista Garza is not ill-appearing.  HENT:     Head: Normocephalic.     Mouth/Throat:     Pharynx: No oropharyngeal exudate.  Eyes:     General: No scleral icterus. Cardiovascular:     Rate and Rhythm: Normal rate and regular rhythm.  Pulmonary:     Effort: Pulmonary effort is normal. No respiratory distress.     Breath sounds: Normal breath sounds.  Abdominal:     General: There is no distension.     Palpations: Abdomen is soft.     Tenderness: There is no abdominal tenderness. There is no rebound.  Musculoskeletal:        General: No tenderness or deformity.     Right lower leg: No edema.     Left lower leg: No edema.  Skin:    General: Skin is warm and dry.     Coloration: Skin is not pale.  Neurological:     General: No focal deficit present.     Mental Status: Krista Garza is alert and oriented to person, place, and time.     Motor: No weakness.  Psychiatric:        Mood and Affect: Mood normal.        Behavior: Behavior normal.     LAB RESULTS:  Lab Results  Component Value Date   NA 138 01/03/2021   K 4.3 01/03/2021   CL 101 01/03/2021   CO2 29 01/03/2021   GLUCOSE 67 01/03/2021   BUN 12 01/03/2021   CREATININE 0.67 01/03/2021   CALCIUM 9.3 01/03/2021   PROT 7.0 01/03/2021   AST 12 01/03/2021   ALT 10 01/03/2021   BILITOT 0.3 01/03/2021   GFRNONAA 105 03/02/2019   GFRAA 121 03/02/2019    Lab Results  Component  Value Date   WBC 6.2 11/19/2020   NEUTROABS 3.8 11/19/2020   HGB 15.0 11/19/2020   HCT 46.9 (H) 11/19/2020   MCV 86.7 11/19/2020   PLT 270 11/19/2020   Lab Results  Component Value Date   IRON 29 11/19/2020   TIBC 375 11/19/2020   IRONPCTSAT 8 (L) 11/19/2020   Lab Results  Component Value Date   FERRITIN 13 11/19/2020     STUDIES: No results found.  ASSESSMENT: Iron deficiency anemia secondary to chronic blood loss.  PLAN:    1.  Iron deficiency anemia: Previously secondary to heavy menses (see below) but post hysterectomy Krista Garza continued to have IDA. Now s/p colonoscopy with Dr. Vickye Astorino Norris 01/18/21. Colonoscopy report reviewed: next due 12/2030. Currently  s/p feraheme x 22 November 2020. Krista Garza stopped oral iron post hysterectomy. Iron studies pending at time of visit. No iv iron at this time.  2.  Abnormal Uterine Bleeding- resolved. S/p Hysterectomy 03/13/20 with Dr. Gilman Schmidt.  3.  Thrombocytosis: resolved with improvement in iron deficiency previously.   4. Elevated hemoglobin and hematocrit- new problem. Would consider workup for erythrocytosis if hemoglobin continues to rise > 16 and/or hematocrit > 48. Krista Garza has new diagnosis of sleep apnea and is starting mouthpiece for management in next few weeks. Question if this is etiology.   Disposition-  No IV iron.  1 mo - lab (cbc w/ diff) 6 mo- lab (cbc, ferritin, iron studies), then see Dr. Grayland Ormond day to week later- la  Patient expressed understanding and was in agreement with this plan. Krista Garza also understands that Krista Garza can call clinic at any time with any questions, concerns, or complaints.   Verlon Au, NP 05/21/2021

## 2021-05-21 NOTE — Progress Notes (Signed)
Patient here for oncology follow-up appointment, expresses no complaints or concerns at this time.    

## 2021-06-10 ENCOUNTER — Other Ambulatory Visit: Payer: Self-pay | Admitting: *Deleted

## 2021-06-10 DIAGNOSIS — D509 Iron deficiency anemia, unspecified: Secondary | ICD-10-CM

## 2021-06-14 ENCOUNTER — Telehealth: Payer: Self-pay | Admitting: Adult Health

## 2021-06-14 NOTE — Telephone Encounter (Signed)
Verified Dr. Ron Parker fax number and sent last OV note via [facility] fax. Nothing further needed at this time ?

## 2021-06-18 ENCOUNTER — Other Ambulatory Visit: Payer: Self-pay

## 2021-06-18 ENCOUNTER — Inpatient Hospital Stay: Payer: No Typology Code available for payment source | Attending: Oncology

## 2021-06-18 DIAGNOSIS — D509 Iron deficiency anemia, unspecified: Secondary | ICD-10-CM | POA: Insufficient documentation

## 2021-06-18 LAB — CBC WITH DIFFERENTIAL/PLATELET
Abs Immature Granulocytes: 0.01 10*3/uL (ref 0.00–0.07)
Basophils Absolute: 0.1 10*3/uL (ref 0.0–0.1)
Basophils Relative: 1 %
Eosinophils Absolute: 0.3 10*3/uL (ref 0.0–0.5)
Eosinophils Relative: 3 %
HCT: 47.8 % — ABNORMAL HIGH (ref 36.0–46.0)
Hemoglobin: 15.4 g/dL — ABNORMAL HIGH (ref 12.0–15.0)
Immature Granulocytes: 0 %
Lymphocytes Relative: 30 %
Lymphs Abs: 2.3 10*3/uL (ref 0.7–4.0)
MCH: 28.4 pg (ref 26.0–34.0)
MCHC: 32.2 g/dL (ref 30.0–36.0)
MCV: 88 fL (ref 80.0–100.0)
Monocytes Absolute: 0.7 10*3/uL (ref 0.1–1.0)
Monocytes Relative: 9 %
Neutro Abs: 4.5 10*3/uL (ref 1.7–7.7)
Neutrophils Relative %: 57 %
Platelets: 288 10*3/uL (ref 150–400)
RBC: 5.43 MIL/uL — ABNORMAL HIGH (ref 3.87–5.11)
RDW: 12.6 % (ref 11.5–15.5)
WBC: 7.8 10*3/uL (ref 4.0–10.5)
nRBC: 0 % (ref 0.0–0.2)

## 2021-07-16 NOTE — Progress Notes (Signed)
Name: Krista Garza   MRN: [id number]    DOB: 04-30-1975   Date:07/16/2021 ? ?     Progress Note ? ?Subjective ? ?Chief Complaint ? ?6 months follow up ? ?HPI ? ?Krista Garza is a 46 year old female here for a 6 month follow up.  ? ?Having some nerve type pain, in both of feet and hands. Occurring for about 1 year, but progressing in frequency and severity. Occurs in the tips of all fiver fingers equally. Does not feel like its coming from wrist or elbow.  In feet its the whole bottom of feet that go numb. No history of back or neck trauma. Has a hard time walking because of it. Will have to walk slowly. Happens if she sits on the toilet too long. Pain is sharp and shocking. Worse in the morning when she first gets out of bed. Is on her feet a lot for work. Does wear boots at work. Works in [employer]. Symptoms were better when she was on vacation, symptoms resolved but came back when she went back to work. She does take a multivitamin.  ? ?Hypertension: ?-Medications: HCTZ 12.5 mg, been on for about 1 year ?-Patient is compliant with above medications and reports no side effects. ?-Checking BP at home (average): 120-130/80-90 ?-Denies any SOB, CP, LE edema. Sometimes will feel dizzy and lightheaded. Sometimes changes in vision.  ? ?Pre-Diabetes: ?-Last A1c 5.8% 10/22 ? ?Health Maintenance: ?-Blood work up to date ?-Mammogram UTD ?-Colon cancer screening: colonoscopy 10/22  ? ?Patient Active Problem List  ? Diagnosis Date Noted  ? Excessive daytime sleepiness 02/05/2021  ? Colon cancer screening   ? Endometriosis determined by laparoscopy   ? Endometrial polyp   ? Abnormal uterine bleeding   ? Menorrhagia 05/19/2019  ? Iron deficiency anemia due to chronic blood loss 03/31/2019  ? ? ?Past Surgical History:  ?Procedure Laterality Date  ? CESAREAN SECTION    ? x2  ? COLONOSCOPY WITH PROPOFOL N/A 01/18/2021  ? Procedure: COLONOSCOPY WITH PROPOFOL;  Surgeon: Lucilla Lame, MD;  Location: [facility];   Service: Endoscopy;  Laterality: N/A;  ? CYSTOSCOPY N/A 03/13/2020  ? Procedure: CYSTOSCOPY;  Surgeon: Homero Fellers, MD;  Location: [facility];  Service: Gynecology;  Laterality: N/A;  ? DILATION AND CURETTAGE OF UTERUS    ? HYSTEROSCOPY WITH D & C N/A 06/21/2019  ? Procedure: DILATATION AND CURETTAGE /HYSTEROSCOPY, Polypectomy with myosure;  Surgeon: Homero Fellers, MD;  Location: [facility];  Service: Gynecology;  Laterality: N/A;  ? TOTAL LAPAROSCOPIC HYSTERECTOMY WITH SALPINGECTOMY Bilateral 03/13/2020  ? Procedure: TOTAL LAPAROSCOPIC HYSTERECTOMY WITH BILATERAL SALPINGECTOMY;  Surgeon: Homero Fellers, MD;  Location: [facility];  Service: Gynecology;  Laterality: Bilateral;  ? ? ?Family History  ?Problem Relation Age of Onset  ? Diabetes Father   ? Hyperlipidemia Father   ? Hypertension Father   ? Cancer Father 41  ?     colon  ? Colon cancer Father   ? ? ?Social History  ? ?Tobacco Use  ? Smoking status: Never  ? Smokeless tobacco: Never  ?Substance Use Topics  ? Alcohol use: Yes  ?  Comment: margaritas occasionally  ? ? ? ?Current Outpatient Medications:  ?  B Complex-C (VITAMIN B + C COMPLEX) TABS, Take 1 tablet by mouth daily. With zinc, Disp: , Rfl:  ?  hydrochlorothiazide (HYDRODIURIL) 12.5 MG tablet, Take 1 tablet (12.5 mg total) by mouth daily., Disp: 90 tablet, Rfl: 3 ?  Multiple Vitamins-Minerals (WOMENS ONE DAILY PO), Take 1 tablet by mouth daily. Gummies, Disp: , Rfl:  ? ?Allergies  ?Allergen Reactions  ? Adhesive [Tape] Rash  ?  Paper tape okay.  bandaids are a problem.  ? Mango Flavor Rash  ? Passion Fruit Flavor Rash  ? Pineapple Rash  ? ? ?I personally reviewed active problem list, medication list, health maintenance, notes from last encounter with the patient/caregiver today. ? ? ?Review of Systems  ?Constitutional:  Negative for chills and fever.  ?Eyes:  Positive for blurred vision.  ?Respiratory:  Negative for shortness of breath.   ?Cardiovascular:  Negative for chest pain  and leg swelling.  ?Skin: Negative.   ?Neurological:  Positive for dizziness and tingling. Negative for weakness.  ? ? ?Objective ? ?Vitals:  ? 07/18/21 0811  ?BP: 124/82  ?Pulse: 77  ?Resp: 16  ?SpO2: 97%  ?Weight: 255 lb (115.7 kg)  ?Height: '6\' 2"'$  (1.88 m)  ? ? ?Body mass index is 32.74 kg/m?. ? ?Physical Exam ?Constitutional:   ?   Appearance: Normal appearance.  ?HENT:  ?   Head: Normocephalic and atraumatic.  ?Eyes:  ?   Conjunctiva/sclera: Conjunctivae normal.  ?Cardiovascular:  ?   Rate and Rhythm: Normal rate and regular rhythm.  ?   Pulses:     ?     Dorsalis pedis pulses are 2+ on the right side and 2+ on the left side.  ?Pulmonary:  ?   Effort: Pulmonary effort is normal.  ?   Breath sounds: Normal breath sounds.  ?Musculoskeletal:     ?   General: No swelling or tenderness. Normal range of motion.  ?   Right lower leg: No edema.  ?   Left lower leg: No edema.  ?   Right foot: Normal range of motion. No deformity, bunion, Charcot foot, foot drop or prominent metatarsal heads.  ?   Left foot: Normal range of motion. No deformity, bunion, Charcot foot, foot drop or prominent metatarsal heads.  ?Feet:  ?   Right foot:  ?   Protective Sensation: 6 sites tested.  6 sites sensed.  ?   Skin integrity: Skin integrity normal.  ?   Toenail Condition: Right toenails are normal.  ?   Left foot:  ?   Protective Sensation: 6 sites tested.  6 sites sensed.  ?   Skin integrity: Skin integrity normal.  ?   Toenail Condition: Left toenails are normal.  ?Skin: ?   General: Skin is warm and dry.  ?Neurological:  ?   General: No focal deficit present.  ?   Mental Status: She is alert. Mental status is at baseline.  ?   Sensory: No sensory deficit.  ?   Motor: No weakness.  ?Psychiatric:     ?   Mood and Affect: Mood normal.     ?   Behavior: Behavior normal.  ? ? ?Recent Results (from the past 2160 hour(s))  ?Iron and TIBC([facility])     Status: None  ? Collection Time: 05/21/21 10:35 AM  ?Result Value Ref Range  ?  Iron 56 28 - 170 ug/dL  ? TIBC 344 250 - 450 ug/dL  ? Saturation Ratios 16 10.4 - 31.8 %  ? UIBC 288 ug/dL  ?  Comment: Performed at [facility], [address], [place], [place] [address]  ?Ferritin     Status: None  ? Collection Time: 05/21/21 10:35 AM  ?Result Value Ref Range  ? Ferritin 53  11 - 307 ng/mL  ?  Comment: Performed at [facility], [address], [place], [place] [address]  ?CBC with Differential/Platelet     Status: Abnormal  ? Collection Time: 05/21/21 10:35 AM  ?Result Value Ref Range  ? WBC 7.5 4.0 - 10.5 K/uL  ? RBC 5.48 (H) 3.87 - 5.11 MIL/uL  ? Hemoglobin 15.7 (H) 12.0 - 15.0 g/dL  ? HCT 47.9 (H) 36.0 - 46.0 %  ? MCV 87.4 80.0 - 100.0 fL  ? MCH 28.6 26.0 - 34.0 pg  ? MCHC 32.8 30.0 - 36.0 g/dL  ? RDW 12.5 11.5 - 15.5 %  ? Platelets 264 150 - 400 K/uL  ? nRBC 0.0 0.0 - 0.2 %  ? Neutrophils Relative % 63 %  ? Neutro Abs 4.8 1.7 - 7.7 K/uL  ? Lymphocytes Relative 23 %  ? Lymphs Abs 1.8 0.7 - 4.0 K/uL  ? Monocytes Relative 10 %  ? Monocytes Absolute 0.7 0.1 - 1.0 K/uL  ? Eosinophils Relative 3 %  ? Eosinophils Absolute 0.2 0.0 - 0.5 K/uL  ? Basophils Relative 1 %  ? Basophils Absolute 0.1 0.0 - 0.1 K/uL  ? Immature Granulocytes 0 %  ? Abs Immature Granulocytes 0.02 0.00 - 0.07 K/uL  ?  Comment: Performed at [facility], [address], [place], [place] [address]  ?CBC with Differential     Status: Abnormal  ? Collection Time: 06/18/21 10:30 AM  ?Result Value Ref Range  ? WBC 7.8 4.0 - 10.5 K/uL  ? RBC 5.43 (H) 3.87 - 5.11 MIL/uL  ? Hemoglobin 15.4 (H) 12.0 - 15.0 g/dL  ? HCT 47.8 (H) 36.0 - 46.0 %  ? MCV 88.0 80.0 - 100.0 fL  ? MCH 28.4 26.0 - 34.0 pg  ? MCHC 32.2 30.0 - 36.0 g/dL  ? RDW 12.6 11.5 - 15.5 %  ? Platelets 288 150 - 400 K/uL  ? nRBC 0.0 0.0 - 0.2 %  ? Neutrophils Relative % 57 %  ? Neutro Abs 4.5 1.7 - 7.7 K/uL  ? Lymphocytes Relative 30 %  ? Lymphs Abs 2.3 0.7 - 4.0 K/uL  ? Monocytes Relative 9 %  ? Monocytes Absolute 0.7 0.1 - 1.0 K/uL  ? Eosinophils  Relative 3 %  ? Eosinophils Absolute 0.3 0.0 - 0.5 K/uL  ? Basophils Relative 1 %  ? Basophils Absolute 0.1 0.0 - 0.1 K/uL  ? Immature Granulocytes 0 %  ? Abs Immature Granulocytes 0.01 0.00 - 0.07 K/uL  ?  Comm

## 2021-07-18 ENCOUNTER — Encounter: Payer: Self-pay | Admitting: Physician Assistant

## 2021-07-18 ENCOUNTER — Ambulatory Visit (INDEPENDENT_AMBULATORY_CARE_PROVIDER_SITE_OTHER): Payer: No Typology Code available for payment source | Admitting: Internal Medicine

## 2021-07-18 VITALS — BP 124/82 | HR 77 | Resp 16 | Ht 74.0 in | Wt 255.0 lb

## 2021-07-18 DIAGNOSIS — I10 Essential (primary) hypertension: Secondary | ICD-10-CM

## 2021-07-18 DIAGNOSIS — G629 Polyneuropathy, unspecified: Secondary | ICD-10-CM | POA: Diagnosis not present

## 2021-07-18 NOTE — Patient Instructions (Addendum)
It was great seeing you today! ? ?Plan discussed at today's visit: ?-Blood work ordered today, results will be uploaded to [facility].  ?-Try to wear more supportive footwear at work something that is supportive and not as tight on the heel  ?-Bring blood pressure cuff to work to check blood pressure when having light headed symptoms  ? ?Follow up in: 3 months  ? ?Take care and let us know if you have any questions or concerns prior to your next visit. ? ?Dr. Rosana Berger ? ?

## 2021-07-25 LAB — COMPLETE METABOLIC PANEL WITH GFR
AG Ratio: 1.4 (calc) (ref 1.0–2.5)
ALT: 9 U/L (ref 6–29)
AST: 14 U/L (ref 10–35)
Albumin: 4.1 g/dL (ref 3.6–5.1)
Alkaline phosphatase (APISO): 54 U/L (ref 31–125)
BUN: 10 mg/dL (ref 7–25)
CO2: 30 mmol/L (ref 20–32)
Calcium: 9.3 mg/dL (ref 8.6–10.2)
Chloride: 101 mmol/L (ref 98–110)
Creat: 0.67 mg/dL (ref 0.50–0.99)
Globulin: 3 g/dL (calc) (ref 1.9–3.7)
Glucose, Bld: 97 mg/dL (ref 65–99)
Potassium: 4.1 mmol/L (ref 3.5–5.3)
Sodium: 139 mmol/L (ref 135–146)
Total Bilirubin: 0.6 mg/dL (ref 0.2–1.2)
Total Protein: 7.1 g/dL (ref 6.1–8.1)
eGFR: 108 mL/min/{1.73_m2} (ref >=60)

## 2021-07-25 LAB — B12 AND FOLATE PANEL
Folate: 15.8 ng/mL
Vitamin B-12: 1988 pg/mL — ABNORMAL HIGH (ref 200–1100)

## 2021-07-25 LAB — CBC WITH DIFFERENTIAL/PLATELET
Absolute Monocytes: 418 cells/uL (ref 200–950)
Basophils Absolute: 41 cells/uL (ref 0–200)
Basophils Relative: 0.8 %
Eosinophils Absolute: 168 cells/uL (ref 15–500)
Eosinophils Relative: 3.3 %
HCT: 46.8 % — ABNORMAL HIGH (ref 35.0–45.0)
Hemoglobin: 15.5 g/dL (ref 11.7–15.5)
Lymphs Abs: 1346 cells/uL (ref 850–3900)
MCH: 29 pg (ref 27.0–33.0)
MCHC: 33.1 g/dL (ref 32.0–36.0)
MCV: 87.5 fL (ref 80.0–100.0)
MPV: 11 fL (ref 7.5–12.5)
Monocytes Relative: 8.2 %
Neutro Abs: 3126 cells/uL (ref 1500–7800)
Neutrophils Relative %: 61.3 %
Platelets: 269 10*3/uL (ref 140–400)
RBC: 5.35 10*6/uL — ABNORMAL HIGH (ref 3.80–5.10)
RDW: 12.6 % (ref 11.0–15.0)
Total Lymphocyte: 26.4 %
WBC: 5.1 10*3/uL (ref 3.8–10.8)

## 2021-07-25 LAB — VITAMIN B1: Vitamin B1 (Thiamine): 7 nmol/L — ABNORMAL LOW (ref 8–30)

## 2021-07-25 LAB — TSH: TSH: 0.77 mIU/L

## 2021-07-25 LAB — HEMOGLOBIN A1C
Hgb A1c MFr Bld: 5.8 % of total Hgb — ABNORMAL HIGH (ref <5.7)
Mean Plasma Glucose: 120 mg/dL
eAG (mmol/L): 6.6 mmol/L

## 2021-08-31 ENCOUNTER — Other Ambulatory Visit: Payer: Self-pay | Admitting: Family Medicine

## 2021-09-01 ENCOUNTER — Other Ambulatory Visit: Payer: Self-pay

## 2021-09-02 ENCOUNTER — Other Ambulatory Visit: Payer: Self-pay

## 2021-09-15 IMAGING — CT CT ABD-PELV W/ CM
2 of 5 series · 15 of 46 positions shown, 17 images · IV contrast (APPLIED)
Comparison: 04/26/2016 pelvic sonogram.

CLINICAL DATA: Abdominal distension, lower abdominal cramping and
pain with heavy vaginal bleeding since fall 1 month prior.
Hysterectomy 03/13/2020.

EXAM:
CT ABDOMEN AND PELVIS WITH CONTRAST
TECHNIQUE: Multidetector CT imaging of the abdomen and pelvis was performed
using the standard protocol following bolus administration of
intravenous contrast.
CONTRAST:  100mL OMNIPAQUE IOHEXOL 300 MG/ML  SOLN

[Series 2: axial st · axial · 0.78mm/px · z∈[-506,-36]mm · 12 of 106 slices shown, 14 images]
[im 6/106  soft-tissue]
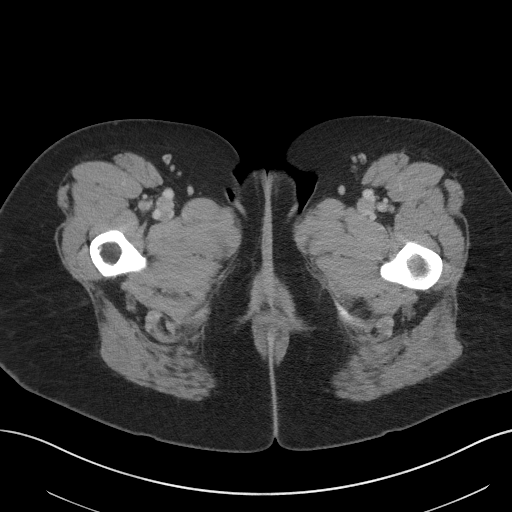
[im 6/106  bone]
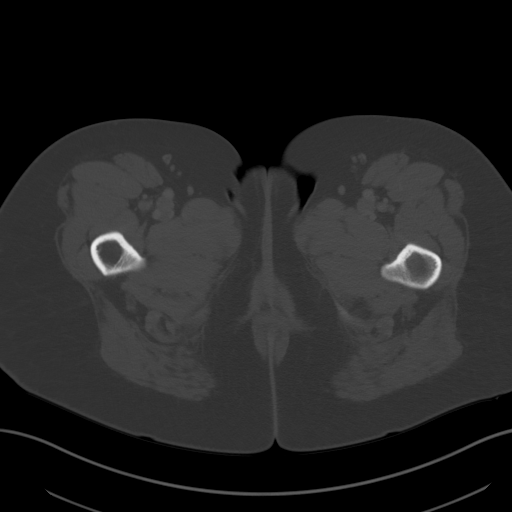
[im 17/106  soft-tissue]
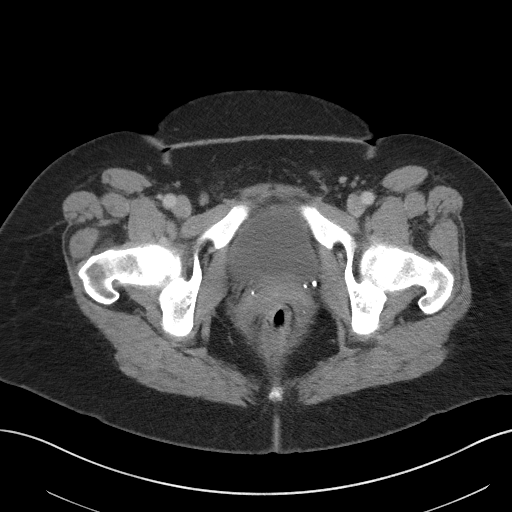
[im 23/106  soft-tissue]
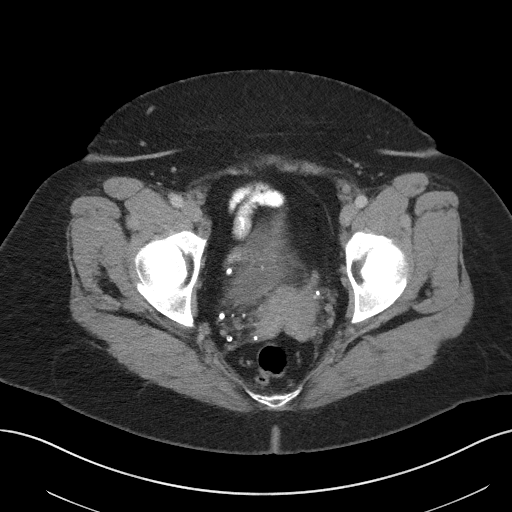
[im 34/106  soft-tissue]
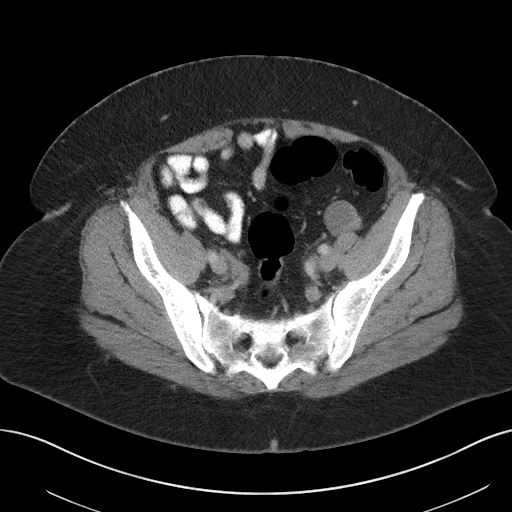
[im 39/106  soft-tissue]
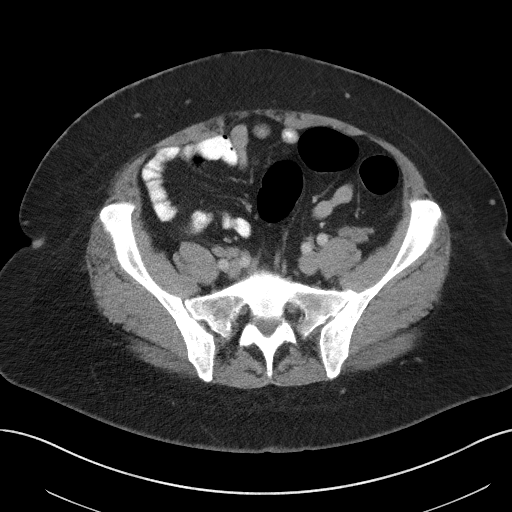
[im 50/106  soft-tissue]
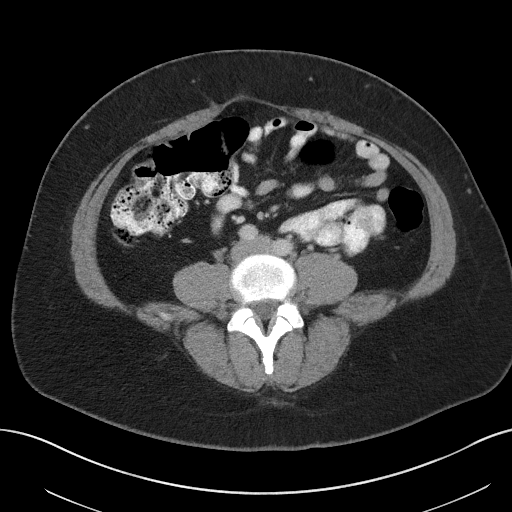
[im 56/106  soft-tissue]
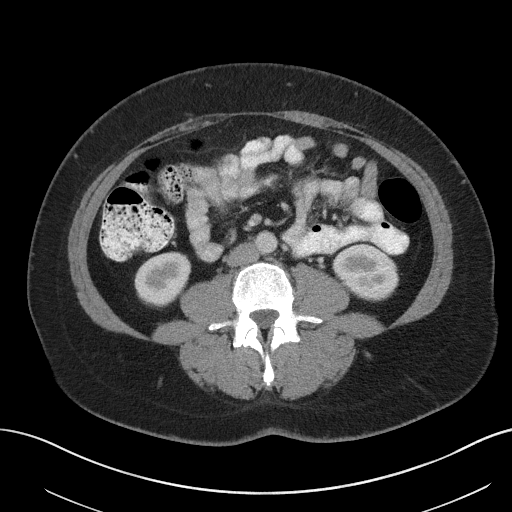
[im 67/106  soft-tissue]
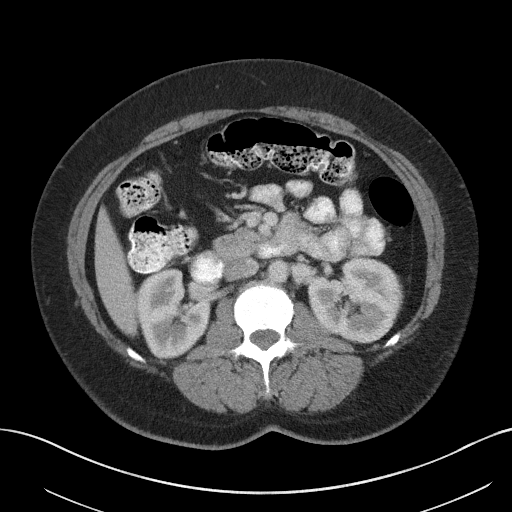
[im 72/106  soft-tissue]
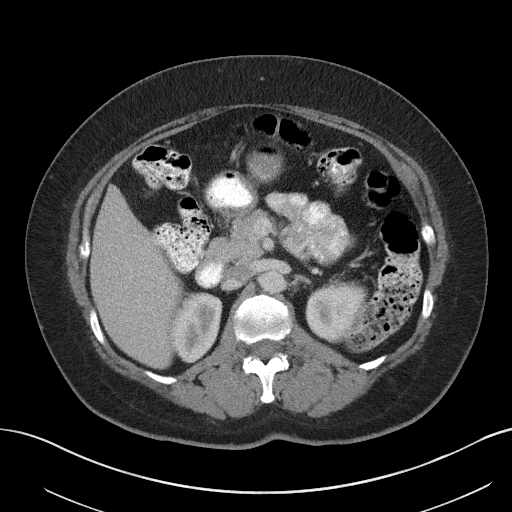
[im 72/106  bone]
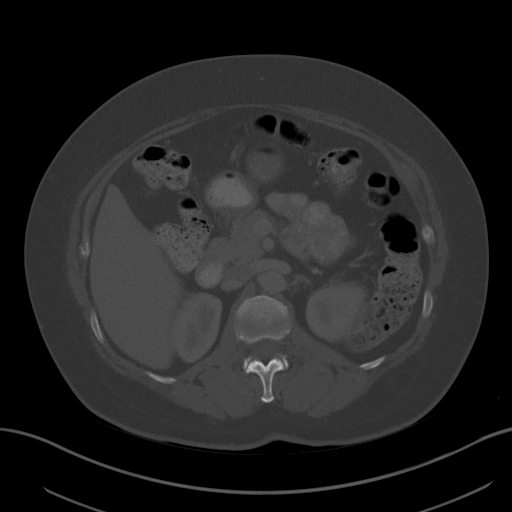
[im 83/106  soft-tissue]
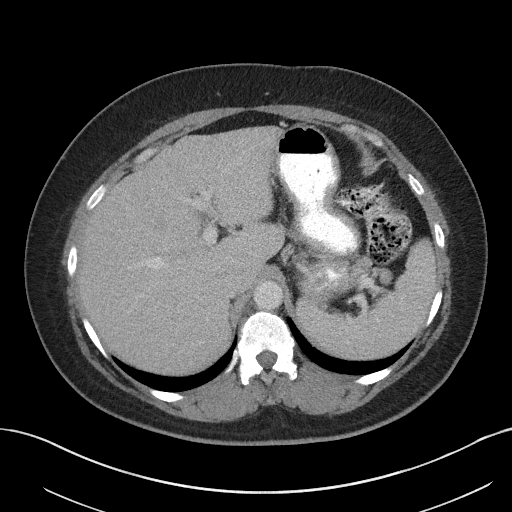
[im 89/106  soft-tissue]
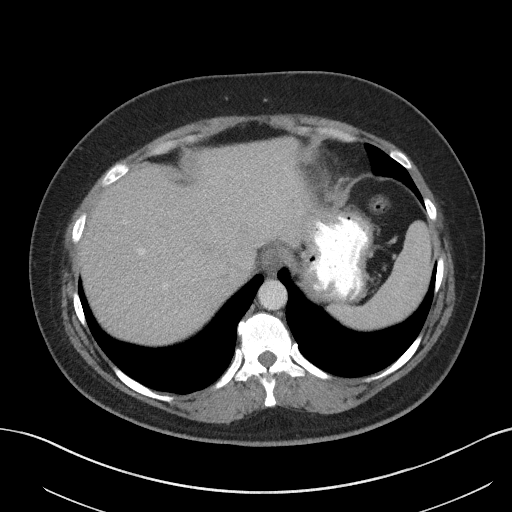
[im 100/106  soft-tissue]
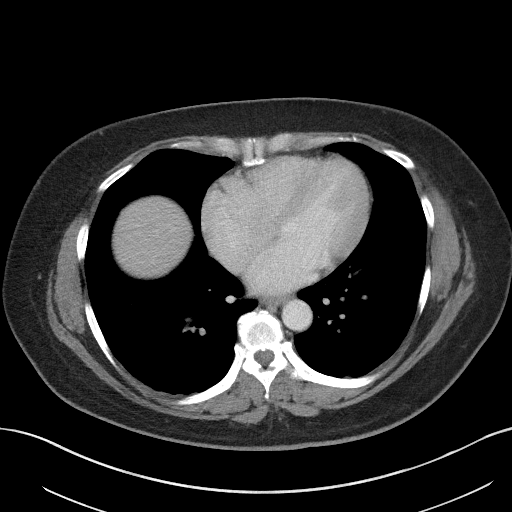

[Series 5: coronal st · coronal · 0.76mm/px · 3 of 107 slices shown]
[im 36/107  soft-tissue]
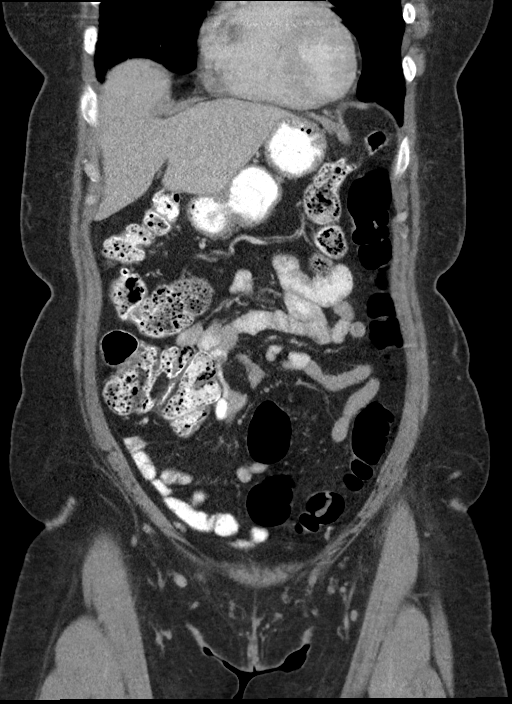
[im 48/107  soft-tissue]
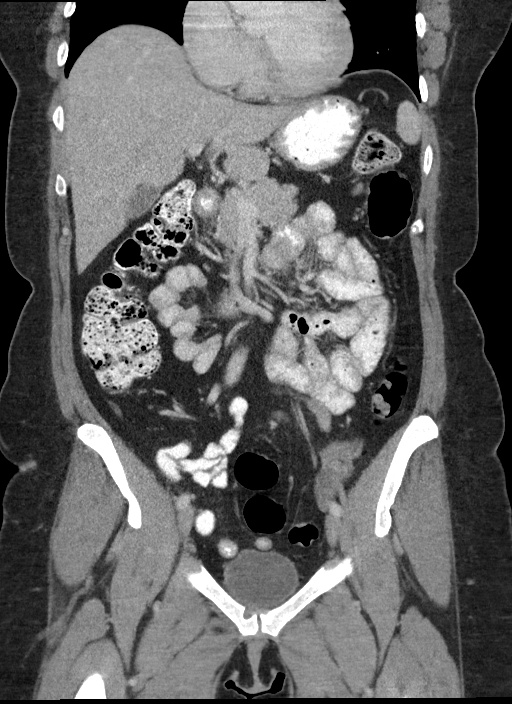
[im 59/107  soft-tissue]
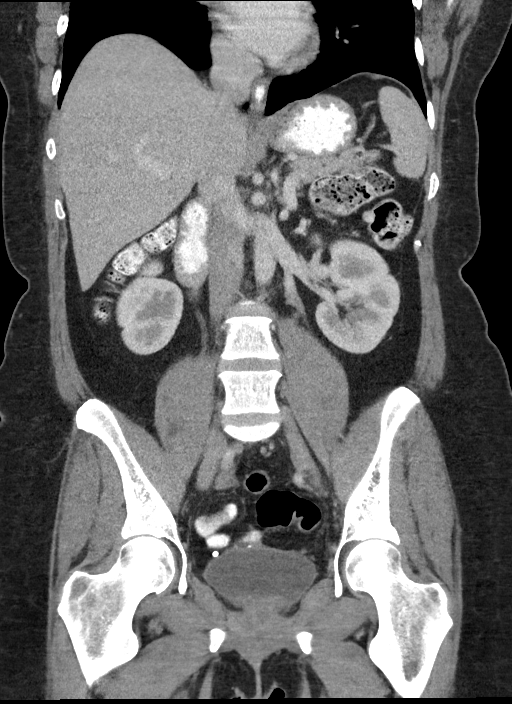

[15 of 46 positions shown; findings below may reference images not displayed]

FINDINGS: Lower chest: No significant pulmonary nodules or acute consolidative
airspace disease.

Hepatobiliary: Normal liver size. Three small scattered hypodense
liver lesions, largest 1.1 cm and the posterior left liver (series
2/image 22). Normal gallbladder with no radiopaque cholelithiasis.
No biliary ductal dilatation.

Pancreas: Normal, with no mass or duct dilation.

Spleen: Normal size. No mass.

Adrenals/Urinary Tract: Normal adrenals. Normal kidneys with no
hydronephrosis and no renal mass. Normal bladder.

Stomach/Bowel: Normal non-distended stomach. Normal caliber small
bowel with no small bowel wall thickening. Normal appendix. Oral
contrast traverses to the colon. Normal large bowel with no
diverticulosis, large bowel wall thickening or pericolonic fat
stranding.

Vascular/Lymphatic: Normal caliber abdominal aorta. Patent portal,
splenic and renal veins. No pathologically enlarged lymph nodes in
the abdomen or pelvis.

Reproductive: Status post hysterectomy, with no discrete mass or
fluid collection at the hysterectomy margin. No right adnexal mass.
Simple 2.3 cm left adnexal cyst (series 2/image 74).

Other: No pneumoperitoneum, ascites or focal fluid collection.

Musculoskeletal: No aggressive appearing focal osseous lesions.
IMPRESSION: 1. No acute abnormality. No evidence of bowel obstruction or acute
bowel inflammation.
2. No discrete mass or fluid collection at the hysterectomy margin.
3. Simple 2.3 cm left adnexal cyst. No follow-up imaging
recommended. Note: This recommendation does not apply to
premenarchal patients and to those with increased risk (genetic,
family history, elevated tumor markers or other high-risk factors)
of ovarian cancer. Reference: JACR [DATE]):[id number]
4. Three small scattered hypodense liver lesions, largest 1.1 cm,
indeterminate. Recommend further characterization with MRI abdomen
without and with IV contrast. This recommendation follows ACR
consensus guidelines: Managing Incidental Findings on Abdominal CT:
White Paper of the ACR Incidental Findings Committee. [HOSPITAL] 9303;[DATE]

## 2021-10-17 ENCOUNTER — Ambulatory Visit: Payer: No Typology Code available for payment source | Admitting: Family Medicine

## 2021-11-12 ENCOUNTER — Inpatient Hospital Stay: Payer: No Typology Code available for payment source

## 2021-11-14 ENCOUNTER — Inpatient Hospital Stay: Payer: No Typology Code available for payment source | Attending: Oncology

## 2021-11-14 DIAGNOSIS — Z79899 Other long term (current) drug therapy: Secondary | ICD-10-CM | POA: Diagnosis not present

## 2021-11-14 DIAGNOSIS — D509 Iron deficiency anemia, unspecified: Secondary | ICD-10-CM | POA: Insufficient documentation

## 2021-11-14 LAB — CBC WITH DIFFERENTIAL/PLATELET
Abs Immature Granulocytes: 0.01 10*3/uL (ref 0.00–0.07)
Basophils Absolute: 0.1 10*3/uL (ref 0.0–0.1)
Basophils Relative: 1 %
Eosinophils Absolute: 0.2 10*3/uL (ref 0.0–0.5)
Eosinophils Relative: 4 %
HCT: 47.1 % — ABNORMAL HIGH (ref 36.0–46.0)
Hemoglobin: 15.1 g/dL — ABNORMAL HIGH (ref 12.0–15.0)
Immature Granulocytes: 0 %
Lymphocytes Relative: 26 %
Lymphs Abs: 1.7 10*3/uL (ref 0.7–4.0)
MCH: 28.1 pg (ref 26.0–34.0)
MCHC: 32.1 g/dL (ref 30.0–36.0)
MCV: 87.7 fL (ref 80.0–100.0)
Monocytes Absolute: 0.6 10*3/uL (ref 0.1–1.0)
Monocytes Relative: 9 %
Neutro Abs: 4 10*3/uL (ref 1.7–7.7)
Neutrophils Relative %: 60 %
Platelets: 256 10*3/uL (ref 150–400)
RBC: 5.37 MIL/uL — ABNORMAL HIGH (ref 3.87–5.11)
RDW: 12.8 % (ref 11.5–15.5)
WBC: 6.5 10*3/uL (ref 4.0–10.5)
nRBC: 0 % (ref 0.0–0.2)

## 2021-11-14 LAB — IRON AND TIBC
Iron: 75 ug/dL (ref 28–170)
Saturation Ratios: 23 % (ref 10.4–31.8)
TIBC: 329 ug/dL (ref 250–450)
UIBC: 254 ug/dL

## 2021-11-14 LAB — FERRITIN: Ferritin: 50 ng/mL (ref 11–307)

## 2021-11-18 NOTE — Progress Notes (Deleted)
[place]  [phone]) [phone] [phone]) [phone]  ID: Krista Garza OB: 05/27/1975  MR#: [id number]  QMG#:[id number]  Patient Care Team: Delsa Grana, PA-C as PCP - General (Family Medicine) Lloyd Huger, MD as Consulting Physician (Oncology)  CHIEF COMPLAINT: Iron deficiency anemia.  INTERVAL HISTORY: Patient returns to clinic today for repeat laboratory work, further evaluation, and consideration of addition IV Feraheme.  She recent underwent hysterectomy. She has increased weakness and fatigue, but otherwise feels well. She has no neurologic complaints.  She denies any recent fevers or illnesses.  She has a good appetite and denies weight loss.  She has no chest pain, shortness of breath, cough, or hemoptysis.  She denies any nausea, vomiting, constipation, or diarrhea.  She has no melena or hematochezia.  She has no urinary complaints. Patient offers no further specific complaints today.  REVIEW OF SYSTEMS:   Review of Systems  Constitutional:  Positive for malaise/fatigue. Negative for fever and weight loss.  Respiratory: Negative.  Negative for cough, hemoptysis and shortness of breath.   Cardiovascular: Negative.  Negative for chest pain and leg swelling.  Gastrointestinal: Negative.  Negative for abdominal pain, blood in stool and melena.  Genitourinary: Negative.  Negative for hematuria.  Musculoskeletal: Negative.  Negative for back pain.  Skin: Negative.  Negative for rash.  Neurological:  Positive for weakness. Negative for dizziness, focal weakness and headaches.  Psychiatric/Behavioral: Negative.  The patient is not nervous/anxious.     As per HPI. Otherwise, a complete review of systems is negative.  PAST MEDICAL HISTORY: Past Medical History:  Diagnosis Date   Anemia    IRON DEFICIENCY. REQUIRED FERAHEME INFUSIONS.   Chest pain, unspecified    Hypertension    Pre-diabetes    Sleep apnea 01/2021    PAST SURGICAL HISTORY: Past  Surgical History:  Procedure Laterality Date   CESAREAN SECTION     x2   COLONOSCOPY WITH PROPOFOL N/A 01/18/2021   Procedure: COLONOSCOPY WITH PROPOFOL;  Surgeon: Lucilla Lame, MD;  Location: [place];  Service: Endoscopy;  Laterality: N/A;   CYSTOSCOPY N/A 03/13/2020   Procedure: CYSTOSCOPY;  Surgeon: Homero Fellers, MD;  Location: [facility];  Service: Gynecology;  Laterality: N/A;   DILATION AND CURETTAGE OF UTERUS     HYSTEROSCOPY WITH D & C N/A 06/21/2019   Procedure: DILATATION AND CURETTAGE /HYSTEROSCOPY, Polypectomy with myosure;  Surgeon: Homero Fellers, MD;  Location: [facility];  Service: Gynecology;  Laterality: N/A;   TOTAL LAPAROSCOPIC HYSTERECTOMY WITH SALPINGECTOMY Bilateral 03/13/2020   Procedure: TOTAL LAPAROSCOPIC HYSTERECTOMY WITH BILATERAL SALPINGECTOMY;  Surgeon: Homero Fellers, MD;  Location: [facility];  Service: Gynecology;  Laterality: Bilateral;    FAMILY HISTORY: Family History  Problem Relation Age of Onset   Diabetes Father    Hyperlipidemia Father    Hypertension Father    Cancer Father 55       colon   Colon cancer Father     ADVANCED DIRECTIVES (Y/N):  N  HEALTH MAINTENANCE: Social History   Tobacco Use   Smoking status: Never   Smokeless tobacco: Never  Vaping Use   Vaping Use: Never used  Substance Use Topics   Alcohol use: Yes    Comment: margaritas occasionally   Drug use: No     Colonoscopy:  PAP:  Bone density:  Lipid panel:  Allergies  Allergen Reactions   Adhesive [Tape] Rash    Paper tape okay.  bandaids are a problem.   Mango Flavor Rash  Passion Fruit Flavor Rash   Pineapple Rash    Current Outpatient Medications  Medication Sig Dispense Refill   B Complex-C (VITAMIN B + C COMPLEX) TABS Take 1 tablet by mouth daily. With zinc     hydrochlorothiazide (HYDRODIURIL) 12.5 MG tablet Take 1 tablet (12.5 mg total) by mouth daily. 90 tablet 3   Multiple Vitamins-Minerals (WOMENS ONE DAILY PO)  Take 1 tablet by mouth daily. Gummies     No current facility-administered medications for this visit.    OBJECTIVE: There were no vitals filed for this visit.    There is no height or weight on file to calculate BMI.    ECOG FS:0 - Asymptomatic  General: Well-developed, well-nourished, no acute distress. Eyes: Pink conjunctiva, anicteric sclera. HEENT: Normocephalic, moist mucous membranes. Lungs: No audible wheezing or coughing. Heart: Regular rate and rhythm. Abdomen: Soft, nontender, no obvious distention. Musculoskeletal: No edema, cyanosis, or clubbing. Neuro: Alert, answering all questions appropriately. Cranial nerves grossly intact. Skin: No rashes or petechiae noted. Psych: Normal affect.   LAB RESULTS:  Lab Results  Component Value Date   NA 139 07/18/2021   K 4.1 07/18/2021   CL 101 07/18/2021   CO2 30 07/18/2021   GLUCOSE 97 07/18/2021   BUN 10 07/18/2021   CREATININE 0.67 07/18/2021   CALCIUM 9.3 07/18/2021   PROT 7.1 07/18/2021   AST 14 07/18/2021   ALT 9 07/18/2021   BILITOT 0.6 07/18/2021   GFRNONAA 105 03/02/2019   GFRAA 121 03/02/2019    Lab Results  Component Value Date   WBC 6.5 11/14/2021   NEUTROABS 4.0 11/14/2021   HGB 15.1 (H) 11/14/2021   HCT 47.1 (H) 11/14/2021   MCV 87.7 11/14/2021   PLT 256 11/14/2021   Lab Results  Component Value Date   IRON 75 11/14/2021   TIBC 329 11/14/2021   IRONPCTSAT 23 11/14/2021   Lab Results  Component Value Date   FERRITIN 50 11/14/2021     STUDIES: No results found.  ASSESSMENT: Iron deficiency anemia secondary to chronic blood loss.  PLAN:    1.  Iron deficiency anemia: Patients hemoglobin and iron stores have decreased despite recent hysterecotmy and she is mildly symptomatic. She has been instructed to continue oral iron supplementation and will proceed with 510 mg IV Feraheme today. Return to clinic in 1 week for a second infusion.  Patient will then return to clinic in 2 months for  repeat laboratory work, further evaluation, and consideration of additional Feraheme.   2.  Heavy menses: Resolved with hysterectomy. 3.  Thrombocytosis: Secondary to iron deficiency.  Feraheme as above.    Patient expressed understanding and was in agreement with this plan. She also understands that She can call clinic at any time with any questions, concerns, or complaints.    Lloyd Huger, MD   11/18/2021 11:32 PM

## 2021-11-19 ENCOUNTER — Inpatient Hospital Stay: Payer: No Typology Code available for payment source | Admitting: Oncology

## 2021-11-19 DIAGNOSIS — D500 Iron deficiency anemia secondary to blood loss (chronic): Secondary | ICD-10-CM

## 2022-01-14 ENCOUNTER — Other Ambulatory Visit: Payer: Self-pay

## 2022-02-11 ENCOUNTER — Other Ambulatory Visit: Payer: Self-pay | Admitting: Nurse Practitioner

## 2022-02-11 ENCOUNTER — Other Ambulatory Visit (HOSPITAL_COMMUNITY): Payer: Self-pay

## 2022-02-11 ENCOUNTER — Telehealth: Payer: No Typology Code available for payment source | Admitting: Physician Assistant

## 2022-02-11 ENCOUNTER — Other Ambulatory Visit: Payer: Self-pay

## 2022-02-11 DIAGNOSIS — L309 Dermatitis, unspecified: Secondary | ICD-10-CM | POA: Diagnosis not present

## 2022-02-11 DIAGNOSIS — S91331A Puncture wound without foreign body, right foot, initial encounter: Secondary | ICD-10-CM | POA: Diagnosis not present

## 2022-02-11 DIAGNOSIS — L089 Local infection of the skin and subcutaneous tissue, unspecified: Secondary | ICD-10-CM

## 2022-02-11 DIAGNOSIS — I10 Essential (primary) hypertension: Secondary | ICD-10-CM

## 2022-02-11 MED ORDER — DOXYCYCLINE HYCLATE 100 MG PO TABS
100.0000 mg | ORAL_TABLET | Freq: Two times a day (BID) | ORAL | 0 refills | Status: DC
  Filled 2022-02-11: qty 14, 7d supply, fill #0

## 2022-02-11 MED ORDER — CLOTRIMAZOLE-BETAMETHASONE 1-0.05 % EX CREA
1.0000 | TOPICAL_CREAM | Freq: Every day | CUTANEOUS | 0 refills | Status: DC
  Filled 2022-02-11: qty 30, 30d supply, fill #0

## 2022-02-11 MED ORDER — HYDROCHLOROTHIAZIDE 12.5 MG PO TABS
12.5000 mg | ORAL_TABLET | Freq: Every day | ORAL | 0 refills | Status: DC
  Filled 2022-02-11: qty 30, 30d supply, fill #0

## 2022-02-11 NOTE — Telephone Encounter (Signed)
Courtesy refill - Patient will need an office visit for further refills. Requested Prescriptions  Pending Prescriptions Disp Refills   hydrochlorothiazide (HYDRODIURIL) 12.5 MG tablet 30 tablet 0    Sig: Take 1 tablet (12.5 mg total) by mouth daily.     Cardiovascular: Diuretics - Thiazide Failed - 02/11/2022  1:41 PM      Failed - Cr in normal range and within 180 days    Creat  Date Value Ref Range Status  07/18/2021 0.67 0.50 - 0.99 mg/dL Final         Failed - K in normal range and within 180 days    Potassium  Date Value Ref Range Status  07/18/2021 4.1 3.5 - 5.3 mmol/L Final         Failed - Na in normal range and within 180 days    Sodium  Date Value Ref Range Status  07/18/2021 139 135 - 146 mmol/L Final         Failed - Valid encounter within last 6 months    Recent Outpatient Visits           6 months ago Polyneuropathy   [place], DO   1 year ago Primary hypertension   [facility] Bo Merino, FNP   1 year ago Primary hypertension   [facility] Bo Merino, FNP   2 years ago Vaginal bleeding   [facility] Delsa Grana, PA-C   2 years ago Visual disturbances   [facility] Delsa Grana, [place]              Passed - Last BP in normal range    BP Readings from Last 1 Encounters:  07/18/21 124/82

## 2022-02-11 NOTE — Telephone Encounter (Signed)
Called pt - LMOMTBC for appt.

## 2022-02-11 NOTE — Patient Instructions (Signed)
  [name], thank you for joining [name], PA-C for today's virtual visit.  While this provider is not your primary care provider (PCP), if your PCP is located in our provider database this encounter information will be shared with them immediately following your visit.   [facility] account gives you access to today's visit and all your visits, tests, and labs performed at [facility] " click here if you don't have a [facility] account or go to [email or web address]  Consent: (Patient) [name] provided verbal consent for this virtual visit at the beginning of the encounter.  Current Medications:  Current Outpatient Medications:    clotrimazole-betamethasone (LOTRISONE) cream, Apply 1 Application topically daily., Disp: 30 g, Rfl: 0   doxycycline (VIBRA-TABS) 100 MG tablet, Take 1 tablet (100 mg total) by mouth 2 (two) times daily., Disp: 14 tablet, Rfl: 0   B Complex-C (VITAMIN B + C COMPLEX) TABS, Take 1 tablet by mouth daily. With zinc, Disp: , Rfl:    hydrochlorothiazide (HYDRODIURIL) 12.5 MG tablet, Take 1 tablet (12.5 mg total) by mouth daily., Disp: 30 tablet, Rfl: 0   Multiple Vitamins-Minerals (WOMENS ONE DAILY PO), Take 1 tablet by mouth daily. Gummies, Disp: , Rfl:    Medications ordered in this encounter:  Meds ordered this encounter  Medications   doxycycline (VIBRA-TABS) 100 MG tablet    Sig: Take 1 tablet (100 mg total) by mouth 2 (two) times daily.    Dispense:  14 tablet    Refill:  0    Order Specific Question:   Supervising Provider    Answer:   Chase Picket [[id number]]   clotrimazole-betamethasone (LOTRISONE) cream    Sig: Apply 1 Application topically daily.    Dispense:  30 g    Refill:  0    Order Specific Question:   Supervising Provider    Answer:   Chase Picket [id number]     *If you need refills on other medications prior to your next appointment, please contact your  pharmacy*  Follow-Up: Call back or seek an in-person evaluation if the symptoms worsen or if the condition fails to improve as anticipated.  [place] [phone]  Other Instructions Please keep skin clean and dry. Take the antibiotic as directed. Make sure to contact your PCP office ASAP to schedule an in-person follow-up for further assessment and ongoing management.  Make sure footwear is not too tight fitting, especially across the top of the feet.  Continue to wear breathable sock materials (cotton). Apply Lotrisone cream as directed.    If you have been instructed to have an in-person evaluation today at a local Urgent Care facility, please use the link below. It will take you to a list of all of our available [facility], including address, phone number and hours of operation. Please do not delay care.  [facility]  If you or a family member do not have a primary care provider, use the link below to schedule a visit and establish care. When you choose a [facility] primary care physician or advanced practice provider, you gain a long-term partner in health. Find a Primary Care Provider  Learn more about [facility]'s in-office and virtual care options: [facility]

## 2022-02-11 NOTE — Progress Notes (Signed)
Virtual Visit Consent   [name], you are scheduled for a virtual visit with a [facility] provider today. Just as with appointments in the office, your consent must be obtained to participate. Your consent will be active for this visit and any virtual visit you may have with one of our providers in the next 365 days. If you have a MyChart account, a copy of this consent can be sent to you electronically.  As this is a virtual visit, video technology does not allow for your provider to perform a traditional examination. This may limit your provider's ability to fully assess your condition. If your provider identifies any concerns that need to be evaluated in person or the need to arrange testing (such as labs, EKG, etc.), we will make arrangements to do so. Although advances in technology are sophisticated, we cannot ensure that it will always work on either your end or our end. If the connection with a video visit is poor, the visit may have to be switched to a telephone visit. With either a video or telephone visit, we are not always able to ensure that we have a secure connection.  By engaging in this virtual visit, you consent to the provision of healthcare and authorize for your insurance to be billed (if applicable) for the services provided during this visit. Depending on your insurance coverage, you may receive a charge related to this service.  I need to obtain your verbal consent now. Are you willing to proceed with your visit today? Krista Garza has provided verbal consent on 02/11/2022 for a virtual visit (video or telephone). Krista Garza, [place]  Date: 02/11/2022 2:30 PM  Virtual Visit via Video Note   I, Krista Garza, connected with  Krista Garza  ([id number], July 16, 1975) on 02/11/22 at  2:00 PM EST by a video-enabled telemedicine application and verified that I am speaking with the correct person using two identifiers.  Location: Patient: Virtual Visit  Location Patient: Home Provider: Virtual Visit Location Provider: Home Office   I discussed the limitations of evaluation and management by telemedicine and the availability of in person appointments. The patient expressed understanding and agreed to proceed.    History of Present Illness: Krista Garza is a 46 y.o. who identifies as a female who was assigned female at birth, and is being seen today for discussion of a couple of separate issues.   Notes issue with episodic rash of her feet over past couple of months, usually one foot at a time. Always on the dorsal surface of the foot. Pruritic and non painful. Tries to swap out work shoes and keep feet clean and dry as much as possible. Denies change to soaps, lotions, clothing materials. Has applied multiple OTC preparations for itch without improvement.   Also notes a month or so ago stepping on a binder clip at home with it braking and a piece of the metal going into her R foot. Feels tetanus up-to-date through work. Noted pain then which improved over a period of 3 days. Has had residual scabbing over the area weeks later, now with redness, warmth and tenderness of underlying area. Denies fever, chills. Denies drainage from area. Is able to bare weight.   Rash of feet usually affecting one of the other at a time. Unsure of trigger. Food allergies. No new soaps, lotions or detergents.   HPI: HPI  Problems:  Patient Active Problem List   Diagnosis Date Noted   Excessive  daytime sleepiness 02/05/2021   Colon cancer screening    Endometriosis determined by laparoscopy    Endometrial polyp    Abnormal uterine bleeding    Menorrhagia 05/19/2019   Iron deficiency anemia due to chronic blood loss 03/31/2019    Allergies:  Allergies  Allergen Reactions   Adhesive [Tape] Rash    Paper tape okay.  bandaids are a problem.   Mango Flavor Rash   Passion Fruit Flavor Rash   Pineapple Rash   Medications:  Current Outpatient Medications:     clotrimazole-betamethasone (LOTRISONE) cream, Apply 1 Application topically daily., Disp: 30 g, Rfl: 0   doxycycline (VIBRA-TABS) 100 MG tablet, Take 1 tablet (100 mg total) by mouth 2 (two) times daily., Disp: 14 tablet, Rfl: 0   B Complex-C (VITAMIN B + C COMPLEX) TABS, Take 1 tablet by mouth daily. With zinc, Disp: , Rfl:    hydrochlorothiazide (HYDRODIURIL) 12.5 MG tablet, Take 1 tablet (12.5 mg total) by mouth daily., Disp: 30 tablet, Rfl: 0   Multiple Vitamins-Minerals (WOMENS ONE DAILY PO), Take 1 tablet by mouth daily. Gummies, Disp: , Rfl:   Observations/Objective: Patient is well-developed, well-nourished in no acute distress.  Resting comfortably at work  Head is normocephalic, atraumatic.  No labored breathing. Speech is clear and coherent with logical content.  Patient is alert and oriented at baseline.  Dorsum of left foot with eczematous like rash noted of mid forefoot, in patches with some areas slightly raised due to excoriation. Dorsum of right foot without normal limits. Site of puncture wound of R plantar foot shows no remaining puncture but some mild delayed healing of tissue underlying scab which patient removed. Is red and inflamed with some surrounding erythema. No fluctuance noted. Area is hot to touch and hard per patient.   Assessment and Plan: 1. Puncture wound of right foot excluding toes with infection, initial encounter - doxycycline (VIBRA-TABS) 100 MG tablet; Take 1 tablet (100 mg total) by mouth 2 (two) times daily.  Dispense: 14 tablet; Refill: 0  1 month or so ago. Some mild delayed healing and now with concern of infection. Supportive measures reviewed. Will start Doxy. Want her to be seen in person either with PCP or at local urgent care for further evaluation and ongoing management.   2. Eczema, unspecified type - clotrimazole-betamethasone (LOTRISONE) cream; Apply 1 Application topically daily.  Dispense: 30 g; Refill: 0  Supportive measures reviewed.  Will start Lotrisone as steroid will help eczematous dermatitis and clotrimazole would cover for any underlying fungal infection as that is most common in these areas. Recommend making sure footwear is not too snug across the top of the foot.   Follow Up Instructions: I discussed the assessment and treatment plan with the patient. The patient was provided an opportunity to ask questions and all were answered. The patient agreed with the plan and demonstrated an understanding of the instructions.  A copy of instructions were sent to the patient via MyChart unless otherwise noted below.    The patient was advised to call back or seek an in-person evaluation if the symptoms worsen or if the condition fails to improve as anticipated.  Time:  I spent 15 minutes with the patient via telehealth technology discussing the above problems/concerns.    Krista Rio, PA-C

## 2022-02-18 NOTE — Progress Notes (Unsigned)
   LMP 04/18/2019 (Approximate) Comment: 9 weeks ongoing menses   Subjective:    Patient ID: Krista Garza, female    DOB: 15-May-1975, 46 y.o.   MRN: [id number]  HPI: Krista Garza is a 46 y.o. female  No chief complaint on file.   Relevant past medical, surgical, family and social history reviewed and updated as indicated. Interim medical history since our last visit reviewed. Allergies and medications reviewed and updated.  Review of Systems  Constitutional: Negative for fever or weight change.  Respiratory: Negative for cough and shortness of breath.   Cardiovascular: Negative for chest pain or palpitations.  Gastrointestinal: Negative for abdominal pain, no bowel changes.  Musculoskeletal: Negative for gait problem or joint swelling.  Skin: Negative for rash.  Neurological: Negative for dizziness or headache.  No other specific complaints in a complete review of systems (except as listed in HPI above). Per HPI unless specifically indicated above     Objective:    LMP 04/18/2019 (Approximate) Comment: 9 weeks ongoing menses  Wt Readings from Last 3 Encounters:  07/18/21 255 lb (115.7 kg)  05/21/21 263 lb (119.3 kg)  02/05/21 254 lb 9.6 oz (115.5 kg)    Physical Exam  Constitutional: Patient appears well-developed and well-nourished. Obese *** No distress.  HEENT: head atraumatic, normocephalic, pupils equal and reactive to light, ears ***, neck supple, throat within normal limits Cardiovascular: Normal rate, regular rhythm and normal heart sounds.  No murmur heard. No BLE edema. Pulmonary/Chest: Effort normal and breath sounds normal. No respiratory distress. Abdominal: Soft.  There is no tenderness. Psychiatric: Patient has a normal mood and affect. behavior is normal. Judgment and thought content normal.  Results for orders placed or performed in visit on 11/14/21  Iron and TIBC([facility])  Result Value Ref Range   Iron 75 28 - 170 ug/dL   TIBC 329 250 -  450 ug/dL   Saturation Ratios 23 10.4 - 31.8 %   UIBC 254 ug/dL  CBC with Differential  Result Value Ref Range   WBC 6.5 4.0 - 10.5 K/uL   RBC 5.37 (H) 3.87 - 5.11 MIL/uL   Hemoglobin 15.1 (H) 12.0 - 15.0 g/dL   HCT 47.1 (H) 36.0 - 46.0 %   MCV 87.7 80.0 - 100.0 fL   MCH 28.1 26.0 - 34.0 pg   MCHC 32.1 30.0 - 36.0 g/dL   RDW 12.8 11.5 - 15.5 %   Platelets 256 150 - 400 K/uL   nRBC 0.0 0.0 - 0.2 %   Neutrophils Relative % 60 %   Neutro Abs 4.0 1.7 - 7.7 K/uL   Lymphocytes Relative 26 %   Lymphs Abs 1.7 0.7 - 4.0 K/uL   Monocytes Relative 9 %   Monocytes Absolute 0.6 0.1 - 1.0 K/uL   Eosinophils Relative 4 %   Eosinophils Absolute 0.2 0.0 - 0.5 K/uL   Basophils Relative 1 %   Basophils Absolute 0.1 0.0 - 0.1 K/uL   Immature Granulocytes 0 %   Abs Immature Granulocytes 0.01 0.00 - 0.07 K/uL  Ferritin  Result Value Ref Range   Ferritin 50 11 - 307 ng/mL      Assessment & Plan:   Problem List Items Addressed This Visit   None    Follow up plan: No follow-ups on file.

## 2022-02-19 ENCOUNTER — Encounter: Payer: Self-pay | Admitting: Nurse Practitioner

## 2022-02-19 ENCOUNTER — Ambulatory Visit (INDEPENDENT_AMBULATORY_CARE_PROVIDER_SITE_OTHER): Payer: No Typology Code available for payment source | Admitting: Nurse Practitioner

## 2022-02-19 ENCOUNTER — Other Ambulatory Visit: Payer: Self-pay | Admitting: Nurse Practitioner

## 2022-02-19 ENCOUNTER — Ambulatory Visit: Admission: RE | Admit: 2022-02-19 | Payer: No Typology Code available for payment source | Source: Ambulatory Visit

## 2022-02-19 VITALS — BP 140/84 | HR 76 | Temp 98.1°F | Resp 16 | Ht 74.0 in | Wt 258.2 lb

## 2022-02-19 DIAGNOSIS — R519 Headache, unspecified: Secondary | ICD-10-CM

## 2022-02-19 DIAGNOSIS — R42 Dizziness and giddiness: Secondary | ICD-10-CM | POA: Diagnosis not present

## 2022-02-19 DIAGNOSIS — I10 Essential (primary) hypertension: Secondary | ICD-10-CM

## 2022-02-19 DIAGNOSIS — G4489 Other headache syndrome: Secondary | ICD-10-CM

## 2022-02-19 DIAGNOSIS — R208 Other disturbances of skin sensation: Secondary | ICD-10-CM

## 2022-02-19 NOTE — Assessment & Plan Note (Signed)
Continue with current treatment plan of hydrochlorothiazide will change medication after resolution of current acute problem.

## 2022-02-20 ENCOUNTER — Ambulatory Visit
Admission: RE | Admit: 2022-02-20 | Discharge: 2022-02-20 | Disposition: A | Discharge status: Home / Self Care | Payer: No Typology Code available for payment source | Source: Ambulatory Visit | Attending: Nurse Practitioner | Admitting: Nurse Practitioner

## 2022-02-20 ENCOUNTER — Ambulatory Visit: Admission: RE | Admit: 2022-02-20 | Payer: No Typology Code available for payment source | Source: Ambulatory Visit

## 2022-02-20 DIAGNOSIS — R42 Dizziness and giddiness: Secondary | ICD-10-CM | POA: Insufficient documentation

## 2022-02-20 DIAGNOSIS — R519 Headache, unspecified: Secondary | ICD-10-CM | POA: Diagnosis present

## 2022-02-20 DIAGNOSIS — G4489 Other headache syndrome: Secondary | ICD-10-CM | POA: Insufficient documentation

## 2022-02-20 LAB — COMPLETE METABOLIC PANEL WITH GFR
AG Ratio: 1.4 (calc) (ref 1.0–2.5)
ALT: 9 U/L (ref 6–29)
AST: 12 U/L (ref 10–35)
Albumin: 4.5 g/dL (ref 3.6–5.1)
Alkaline phosphatase (APISO): 64 U/L (ref 31–125)
BUN: 11 mg/dL (ref 7–25)
CO2: 27 mmol/L (ref 20–32)
Calcium: 10 mg/dL (ref 8.6–10.2)
Chloride: 100 mmol/L (ref 98–110)
Creat: 0.74 mg/dL (ref 0.50–0.99)
Globulin: 3.3 g/dL (calc) (ref 1.9–3.7)
Glucose, Bld: 94 mg/dL (ref 65–99)
Potassium: 3.9 mmol/L (ref 3.5–5.3)
Sodium: 138 mmol/L (ref 135–146)
Total Bilirubin: 0.5 mg/dL (ref 0.2–1.2)
Total Protein: 7.8 g/dL (ref 6.1–8.1)
eGFR: 100 mL/min/{1.73_m2} (ref >=60)

## 2022-02-20 LAB — CBC WITH DIFFERENTIAL/PLATELET
Absolute Monocytes: 605 cells/uL (ref 200–950)
Basophils Absolute: 63 cells/uL (ref 0–200)
Basophils Relative: 1 %
Eosinophils Absolute: 202 cells/uL (ref 15–500)
Eosinophils Relative: 3.2 %
HCT: 48.4 % — ABNORMAL HIGH (ref 35.0–45.0)
Hemoglobin: 16.2 g/dL — ABNORMAL HIGH (ref 11.7–15.5)
Lymphs Abs: 1607 cells/uL (ref 850–3900)
MCH: 28.2 pg (ref 27.0–33.0)
MCHC: 33.5 g/dL (ref 32.0–36.0)
MCV: 84.2 fL (ref 80.0–100.0)
MPV: 11.1 fL (ref 7.5–12.5)
Monocytes Relative: 9.6 %
Neutro Abs: 3824 cells/uL (ref 1500–7800)
Neutrophils Relative %: 60.7 %
Platelets: 299 10*3/uL (ref 140–400)
RBC: 5.75 10*6/uL — ABNORMAL HIGH (ref 3.80–5.10)
RDW: 13 % (ref 11.0–15.0)
Total Lymphocyte: 25.5 %
WBC: 6.3 10*3/uL (ref 3.8–10.8)

## 2022-02-20 LAB — HEMOGLOBIN A1C
Hgb A1c MFr Bld: 6.1 % of total Hgb — ABNORMAL HIGH (ref <5.7)
Mean Plasma Glucose: 128 mg/dL
eAG (mmol/L): 7.1 mmol/L

## 2022-02-20 LAB — SEDIMENTATION RATE: Sed Rate: 9 mm/h (ref 0–20)

## 2022-02-20 LAB — C-REACTIVE PROTEIN: CRP: 4.1 mg/L (ref <8.0)

## 2022-02-20 MED ORDER — GADOBUTROL 1 MMOL/ML IV SOLN
10.0000 mL | Freq: Once | INTRAVENOUS | Status: AC | PRN
  Administered 2022-02-20: 10 mL via INTRAVENOUS

## 2022-02-21 ENCOUNTER — Other Ambulatory Visit: Payer: Self-pay

## 2022-02-21 ENCOUNTER — Other Ambulatory Visit: Payer: Self-pay | Admitting: Nurse Practitioner

## 2022-02-21 DIAGNOSIS — I10 Essential (primary) hypertension: Secondary | ICD-10-CM

## 2022-02-21 MED ORDER — AMLODIPINE BESYLATE 5 MG PO TABS
5.0000 mg | ORAL_TABLET | Freq: Every day | ORAL | 0 refills | Status: DC
  Filled 2022-02-21: qty 30, 30d supply, fill #0

## 2022-02-27 ENCOUNTER — Ambulatory Visit: Payer: No Typology Code available for payment source | Admitting: Nurse Practitioner

## 2022-03-03 ENCOUNTER — Encounter: Payer: Self-pay | Admitting: Oncology

## 2022-03-08 ENCOUNTER — Encounter: Payer: Self-pay | Admitting: Oncology

## 2022-03-16 ENCOUNTER — Other Ambulatory Visit: Payer: Self-pay | Admitting: Nurse Practitioner

## 2022-03-16 DIAGNOSIS — I10 Essential (primary) hypertension: Secondary | ICD-10-CM

## 2022-03-18 ENCOUNTER — Other Ambulatory Visit: Payer: Self-pay

## 2022-03-18 ENCOUNTER — Other Ambulatory Visit: Payer: Self-pay | Admitting: Nurse Practitioner

## 2022-03-18 DIAGNOSIS — I10 Essential (primary) hypertension: Secondary | ICD-10-CM

## 2022-03-20 ENCOUNTER — Other Ambulatory Visit: Payer: Self-pay

## 2022-03-20 MED FILL — Amlodipine Besylate Tab 5 MG (Base Equivalent): ORAL | 90 days supply | Qty: 90 | Fill #0 | Status: CN

## 2022-03-20 NOTE — Telephone Encounter (Signed)
Requested Prescriptions  Pending Prescriptions Disp Refills   amLODipine (NORVASC) 5 MG tablet 90 tablet 0    Sig: Take 1 tablet (5 mg total) by mouth daily.     Cardiovascular: Calcium Channel Blockers 2 Failed - 03/18/2022 11:09 AM      Failed - Last BP in normal range    BP Readings from Last 1 Encounters:  02/19/22 (!) 140/84         Passed - Last Heart Rate in normal range    Pulse Readings from Last 1 Encounters:  02/19/22 76         Passed - Valid encounter within last 6 months    Recent Outpatient Visits           4 weeks ago [place], Julie F, FNP   8 months ago Polyneuropathy   [place], DO   1 year ago Primary hypertension   [facility] Bo Merino, FNP   1 year ago Primary hypertension   [place], FNP   2 years ago Vaginal bleeding   [facility] Delsa Grana, [place]

## 2022-04-01 ENCOUNTER — Other Ambulatory Visit: Payer: Self-pay

## 2022-04-02 ENCOUNTER — Other Ambulatory Visit: Payer: Self-pay

## 2022-04-02 MED FILL — Amlodipine Besylate Tab 5 MG (Base Equivalent): ORAL | 30 days supply | Qty: 30 | Fill #0 | Status: AC

## 2022-04-02 MED FILL — Amlodipine Besylate Tab 5 MG (Base Equivalent): ORAL | 60 days supply | Qty: 60 | Fill #0 | Status: AC

## 2022-04-09 ENCOUNTER — Ambulatory Visit (INDEPENDENT_AMBULATORY_CARE_PROVIDER_SITE_OTHER): Payer: 59 | Admitting: Nurse Practitioner

## 2022-04-09 ENCOUNTER — Encounter: Payer: Self-pay | Admitting: Nurse Practitioner

## 2022-04-09 ENCOUNTER — Other Ambulatory Visit: Payer: Self-pay

## 2022-04-09 VITALS — BP 130/78 | HR 97 | Temp 97.8°F | Resp 18 | Ht 74.0 in | Wt 259.2 lb

## 2022-04-09 DIAGNOSIS — J0140 Acute pansinusitis, unspecified: Secondary | ICD-10-CM | POA: Diagnosis not present

## 2022-04-09 MED ORDER — AMOXICILLIN-POT CLAVULANATE 875-125 MG PO TABS
1.0000 | ORAL_TABLET | Freq: Two times a day (BID) | ORAL | 0 refills | Status: DC
  Filled 2022-04-09: qty 20, 10d supply, fill #0

## 2022-04-09 NOTE — Progress Notes (Signed)
BP 130/78   Pulse 97   Temp 97.8 F (36.6 C) (Oral)   Resp 18   Ht '6\' 2"'$  (1.88 m)   Wt 259 lb 3.2 oz (117.6 kg)   LMP 04/18/2019 (Approximate) Comment: 9 weeks ongoing menses  SpO2 96%   BMI 33.28 kg/m    Subjective:    Patient ID: Krista Garza, female    DOB: 28-Jun-1975, 47 y.o.   MRN: [id number]  HPI: Krista Garza is a 47 y.o. female  Chief Complaint  Patient presents with   URI    Headache, congested, green sputum for 7 days   URI/sinus infection:  she says symptoms started last week. She says that she had a headache, facial pressure, fatigue, cough, nausea and increased phlegm. She says she has a lot of green mucous coming out of her nose. She has been taking nyquil and mucinex.  She says she has had a fever and chills.  She denies any shortness of breath.  She says she also had a sore throat. She says she was trying to drink plenty of fluids.  Recommend taking zyrtec, flonase, mucinex, vitamin d, vitamin c, and zinc. Push fluids and get rest.   Will send in augmentin for sinus infection.   Relevant past medical, surgical, family and social history reviewed and updated as indicated. Interim medical history since our last visit reviewed. Allergies and medications reviewed and updated.  Review of Systems  Constitutional: positive for fever , negative for  weight change.  HEETN: positive for nasal congestion, sore throat, nasal drainage Respiratory: positive  for cough and negative for shortness of breath.   Cardiovascular: Negative for chest pain or palpitations.  Gastrointestinal: Negative for abdominal pain, no bowel changes.  Musculoskeletal: Negative for gait problem or joint swelling.  Skin: Negative for rash.  Neurological: Negative for dizziness, positive for  headache.  No other specific complaints in a complete review of systems (except as listed in HPI above).      Objective:    BP 130/78   Pulse 97   Temp 97.8 F (36.6 C) (Oral)   Resp 18   Ht 6'  2" (1.88 m)   Wt 259 lb 3.2 oz (117.6 kg)   LMP 04/18/2019 (Approximate) Comment: 9 weeks ongoing menses  SpO2 96%   BMI 33.28 kg/m   Wt Readings from Last 3 Encounters:  04/09/22 259 lb 3.2 oz (117.6 kg)  02/19/22 258 lb 3.2 oz (117.1 kg)  07/18/21 255 lb (115.7 kg)    Physical Exam  Constitutional: Patient appears well-developed and well-nourished. Obese  No distress.  HEENT: head atraumatic, normocephalic, pupils equal and reactive to light, ears Tms clear, neck supple, throat within normal limits Cardiovascular: Normal rate, regular rhythm and normal heart sounds.  No murmur heard. No BLE edema. Pulmonary/Chest: Effort normal and breath sounds normal. No respiratory distress. Abdominal: Soft.  There is no tenderness. Psychiatric: Patient has a normal mood and affect. behavior is normal. Judgment and thought content normal.  Results for orders placed or performed in visit on 02/19/22  CBC with Differential/Platelet  Result Value Ref Range   WBC 6.3 3.8 - 10.8 Thousand/uL   RBC 5.75 (H) 3.80 - 5.10 Million/uL   Hemoglobin 16.2 (H) 11.7 - 15.5 g/dL   HCT 48.4 (H) 35.0 - 45.0 %   MCV 84.2 80.0 - 100.0 fL   MCH 28.2 27.0 - 33.0 pg   MCHC 33.5 32.0 - 36.0 g/dL   RDW 13.0 11.0 -  15.0 %   Platelets 299 140 - 400 Thousand/uL   MPV 11.1 7.5 - 12.5 fL   Neutro Abs 3,824 1,500 - 7,800 cells/uL   Lymphs Abs 1,607 850 - 3,900 cells/uL   Absolute Monocytes 605 200 - 950 cells/uL   Eosinophils Absolute 202 15 - 500 cells/uL   Basophils Absolute 63 0 - 200 cells/uL   Neutrophils Relative % 60.7 %   Total Lymphocyte 25.5 %   Monocytes Relative 9.6 %   Eosinophils Relative 3.2 %   Basophils Relative 1.0 %  COMPLETE METABOLIC PANEL WITH GFR  Result Value Ref Range   Glucose, Bld 94 65 - 99 mg/dL   BUN 11 7 - 25 mg/dL   Creat 0.74 0.50 - 0.99 mg/dL   eGFR 100 > OR = 60 mL/min/1.33m   BUN/Creatinine Ratio SEE NOTE: 6 - 22 (calc)   Sodium 138 135 - 146 mmol/L   Potassium 3.9 3.5 - 5.3  mmol/L   Chloride 100 98 - 110 mmol/L   CO2 27 20 - 32 mmol/L   Calcium 10.0 8.6 - 10.2 mg/dL   Total Protein 7.8 6.1 - 8.1 g/dL   Albumin 4.5 3.6 - 5.1 g/dL   Globulin 3.3 1.9 - 3.7 g/dL (calc)   AG Ratio 1.4 1.0 - 2.5 (calc)   Total Bilirubin 0.5 0.2 - 1.2 mg/dL   Alkaline phosphatase (APISO) 64 31 - 125 U/L   AST 12 10 - 35 U/L   ALT 9 6 - 29 U/L  Hemoglobin A1c  Result Value Ref Range   Hgb A1c MFr Bld 6.1 (H) <5.7 % of total Hgb   Mean Plasma Glucose 128 mg/dL   eAG (mmol/L) 7.1 mmol/L  Sedimentation rate  Result Value Ref Range   Sed Rate 9 0 - 20 mm/h  C-reactive protein  Result Value Ref Range   CRP 4.1 <8.0 mg/L      Assessment & Plan:   Problem List Items Addressed This Visit   None Visit Diagnoses     Acute non-recurrent pansinusitis    -  Primary   start augmentin,Recommend taking zyrtec, flonase, mucinex, vitamin d, vitamin c, and zinc. Push fluids and get rest.   Relevant Medications   amoxicillin-clavulanate (AUGMENTIN) 875-125 MG tablet        Follow up plan: Return if symptoms worsen or fail to improve.

## 2022-05-09 ENCOUNTER — Other Ambulatory Visit: Payer: Self-pay

## 2022-07-06 ENCOUNTER — Emergency Department (HOSPITAL_COMMUNITY)
Admission: EM | Admit: 2022-07-06 | Discharge: 2022-07-07 | Disposition: A | Discharge status: Home / Self Care | Payer: 59 | Attending: Emergency Medicine | Admitting: Emergency Medicine

## 2022-07-06 ENCOUNTER — Other Ambulatory Visit: Payer: Self-pay

## 2022-07-06 ENCOUNTER — Encounter (HOSPITAL_COMMUNITY): Payer: Self-pay | Admitting: Emergency Medicine

## 2022-07-06 ENCOUNTER — Emergency Department (HOSPITAL_COMMUNITY): Payer: 59

## 2022-07-06 DIAGNOSIS — I10 Essential (primary) hypertension: Secondary | ICD-10-CM | POA: Diagnosis not present

## 2022-07-06 DIAGNOSIS — R42 Dizziness and giddiness: Secondary | ICD-10-CM | POA: Diagnosis not present

## 2022-07-06 DIAGNOSIS — R0789 Other chest pain: Secondary | ICD-10-CM | POA: Diagnosis not present

## 2022-07-06 DIAGNOSIS — Z79899 Other long term (current) drug therapy: Secondary | ICD-10-CM | POA: Diagnosis not present

## 2022-07-06 DIAGNOSIS — R079 Chest pain, unspecified: Secondary | ICD-10-CM | POA: Diagnosis not present

## 2022-07-06 LAB — BASIC METABOLIC PANEL
Anion gap: 12 (ref 5–15)
BUN: 14 mg/dL (ref 6–20)
CO2: 24 mmol/L (ref 22–32)
Calcium: 9.2 mg/dL (ref 8.9–10.3)
Chloride: 101 mmol/L (ref 98–111)
Creatinine, Ser: 0.77 mg/dL (ref 0.44–1.00)
GFR, Estimated: >60 mL/min (ref >60)
Glucose, Bld: 108 mg/dL — ABNORMAL HIGH (ref 70–99)
Potassium: 3.6 mmol/L (ref 3.5–5.1)
Sodium: 137 mmol/L (ref 135–145)

## 2022-07-06 LAB — CBC
HCT: 46.2 % — ABNORMAL HIGH (ref 36.0–46.0)
Hemoglobin: 14.9 g/dL (ref 12.0–15.0)
MCH: 27.4 pg (ref 26.0–34.0)
MCHC: 32.3 g/dL (ref 30.0–36.0)
MCV: 85.1 fL (ref 80.0–100.0)
Platelets: 276 10*3/uL (ref 150–400)
RBC: 5.43 MIL/uL — ABNORMAL HIGH (ref 3.87–5.11)
RDW: 13.3 % (ref 11.5–15.5)
WBC: 8.7 10*3/uL (ref 4.0–10.5)
nRBC: 0 % (ref 0.0–0.2)

## 2022-07-06 LAB — TROPONIN I (HIGH SENSITIVITY): Troponin I (High Sensitivity): 6 ng/L (ref <18)

## 2022-07-06 NOTE — ED Triage Notes (Signed)
Pt reports she was a work yesterday performing normal activities, began feeling dizzy and then developed CP across her chest radiating to her pain. Pt reports she stopped taking her blood pressure medication a few days ago because it made her hair fall out. Pt reports pain is constant but intermittently, c/o weakness and SOB.

## 2022-07-07 LAB — TROPONIN I (HIGH SENSITIVITY): Troponin I (High Sensitivity): 5 ng/L (ref <18)

## 2022-07-07 MED ORDER — LIDOCAINE VISCOUS HCL 2 % MT SOLN
15.0000 mL | Freq: Once | OROMUCOSAL | Status: AC
  Administered 2022-07-07: 15 mL via ORAL
  Filled 2022-07-07: qty 15

## 2022-07-07 MED ORDER — ALUM & MAG HYDROXIDE-SIMETH 200-200-20 MG/5ML PO SUSP
30.0000 mL | Freq: Once | ORAL | Status: AC
  Administered 2022-07-07: 30 mL via ORAL
  Filled 2022-07-07: qty 30

## 2022-07-07 NOTE — ED Provider Notes (Signed)
[place] EMERGENCY DEPARTMENT AT [facility] Provider Note  CSN: [id number] Arrival date & time: 07/06/22 2117  Chief Complaint(s) Dizziness and Chest Pain  HPI Krista Garza is a 47 y.o. female with a past medical history listed below who presents to the emergency department with approximately 24 hours of chest discomfort described as aching.  No associated shortness of breath.  Pain is nonexertional and nonradiating.  Worse only with rapid movement.  No recent fevers or infections.  No coughing or congestion.  Patient is not on OCPs, denies any recent travel.  No unilateral leg swelling.  Patient has not taken any medicine for discomfort.  Reports that she has also had intermittent lightheadedness while standing that improved after sitting.  The history is provided by the patient.    Past Medical History Past Medical History:  Diagnosis Date   Anemia    IRON DEFICIENCY. REQUIRED FERAHEME INFUSIONS.   Chest pain, unspecified    Hypertension    Pre-diabetes    Sleep apnea 01/2021   Patient Active Problem List   Diagnosis Date Noted   Primary hypertension 02/19/2022   Excessive daytime sleepiness 02/05/2021   Colon cancer screening    Endometriosis determined by laparoscopy    Endometrial polyp    Abnormal uterine bleeding    Menorrhagia 05/19/2019   Iron deficiency anemia due to chronic blood loss 03/31/2019   Home Medication(s) Prior to Admission medications   Medication Sig Start Date End Date Taking? Authorizing Provider  amLODipine (NORVASC) 5 MG tablet Take 1 tablet (5 mg total) by mouth daily. 03/20/22   Berniece Salines, FNP  amoxicillin-clavulanate (AUGMENTIN) 875-125 MG tablet Take 1 tablet by mouth 2 (two) times daily. 04/09/22   Berniece Salines, FNP  clotrimazole-betamethasone (LOTRISONE) cream Apply 1 Application topically daily. 02/11/22   Waldon Merl, PA-C  medroxyPROGESTERone (PROVERA) 10 MG tablet Take 2 tablets (20 mg total) by mouth in the  morning and at bedtime. Take 20 mg twice a day for ten days. Then take 10 mg once a day. Patient taking differently: Take 10-20 mg by mouth daily. 02/20/20 03/13/20  Natale Milch, MD                                                                                                                                    Allergies Adhesive [tape], Mango flavor, Passion fruit flavor, and Pineapple  Review of Systems Review of Systems As noted in HPI  Physical Exam Vital Signs  I have reviewed the triage vital signs BP 127/81   Pulse 76   Temp 98.2 F (36.8 C) (Oral)   Resp 16   Ht  (1.905 m)   Wt 115.2 kg   LMP 04/18/2019 (Approximate) Comment: 9 weeks ongoing menses  SpO2 100%   BMI 31.75 kg/m   Physical Exam Vitals reviewed.  Constitutional:      General: She is not  in acute distress.    Appearance: She is well-developed. She is not diaphoretic.  HENT:     Head: Normocephalic and atraumatic.     Nose: Nose normal.  Eyes:     General: No scleral icterus.       Right eye: No discharge.        Left eye: No discharge.     Conjunctiva/sclera: Conjunctivae normal.     Pupils: Pupils are equal, round, and reactive to light.  Cardiovascular:     Rate and Rhythm: Normal rate and regular rhythm.     Heart sounds: No murmur heard.    No friction rub. No gallop.  Pulmonary:     Effort: Pulmonary effort is normal. No respiratory distress.     Breath sounds: Normal breath sounds. No stridor. No rales.  Abdominal:     General: There is no distension.     Palpations: Abdomen is soft.     Tenderness: There is no abdominal tenderness.  Musculoskeletal:     Cervical back: Normal range of motion and neck supple.     Thoracic back: Spasms and tenderness present.       Back:  Skin:    General: Skin is warm and dry.     Findings: No erythema or rash.  Neurological:     Mental Status: She is alert and oriented to person, place, and time.     ED Results and  Treatments Labs (all labs ordered are listed, but only abnormal results are displayed) Labs Reviewed  BASIC METABOLIC PANEL - Abnormal; Notable for the following components:      Result Value   Glucose, Bld 108 (*)    All other components within normal limits  CBC - Abnormal; Notable for the following components:   RBC 5.43 (*)    HCT 46.2 (*)    All other components within normal limits  TROPONIN I (HIGH SENSITIVITY)  TROPONIN I (HIGH SENSITIVITY)                                                                                                                         EKG  EKG Interpretation  Date/Time:  Monday July 07 2022 05:28:05 EDT Ventricular Rate:  61 PR Interval:  122 QRS Duration: 109 QT Interval:  458 QTC Calculation: 462 R Axis:   -7 Text Interpretation: Sinus rhythm RSR' in V1 or V2, probably normal variant No significant change was found Confirmed by Drema Pry [id number]) on 07/07/2022 6:13:48 AM       Radiology DG Chest 2 View  Result Date: 07/06/2022 CLINICAL DATA:  Chest pain, dizziness. EXAM: CHEST - 2 VIEW COMPARISON:  Remote radiograph 03/06/2007 FINDINGS: Lung volumes are low. The heart is normal in size for technique. Stable mediastinal contours. Bronchovascular crowding. No pulmonary edema. No focal airspace disease. No pleural fluid or pneumothorax. No acute osseous abnormalities. IMPRESSION: Low lung volumes with bronchovascular crowding. Electronically Signed   By: Narda Rutherford M.D.   On:  07/06/2022 21:48    Medications Ordered in ED Medications  alum & mag hydroxide-simeth (MAALOX/MYLANTA) 200-200-20 MG/5ML suspension 30 mL (30 mLs Oral Given 07/07/22 0528)    And  lidocaine (XYLOCAINE) 2 % viscous mouth solution 15 mL (15 mLs Oral Given 07/07/22 0528)                                                                                                                                     Procedures Procedures  (including critical care time)  Medical  Decision Making / ED Course  Click here for ABCD2, HEART and other calculators  Medical Decision Making Amount and/or Complexity of Data Reviewed Labs: ordered. Decision-making details documented in ED Course. Radiology: ordered and independent interpretation performed. Decision-making details documented in ED Course. ECG/medicine tests: ordered and independent interpretation performed. Decision-making details documented in ED Course.  Risk OTC drugs. Prescription drug management.    This patient presents to the ED for concern of chest pain, this involves an extensive number of treatment options, and is a complaint that carries with it a high risk of complications and morbidity. The differential diagnosis includes but not limited to MSK, GI etiology, ACS, PE, pneumonia, pneumothorax, dissection.  Initial intervention:  GI cocktail  Work up: [employer] and/or imaging independently interpreted by me) EKG without acute ischemic changes, dysrhythmias or blocks Initial troponin negative Heart score less than 3 and delta troponin negative CBC without leukocytosis or anemia Metabolic panel without significant electrolyte derangement or renal sufficiency Chest x-ray without evidence of pneumonia, pneumothorax, pulmonary edema or pleural effusion  Reassessment: Minimal improvement after GI cocktail   Feel patient is rule out for ACS.  Low pretest probability for pulmonary embolism and patient is PERC negative.  Presentation is not classic for dissection or esophageal perforation.     Final Clinical Impression(s) / ED Diagnoses Final diagnoses:  Chest discomfort  Lightheadedness   The patient appears reasonably screened and/or stabilized for discharge and I doubt any other medical condition or other [facility] requiring further screening, evaluation, or treatment in the ED at this time. I have discussed the findings, Dx and Tx plan with the patient/family who expressed understanding and agree(s)  with the plan. Discharge instructions discussed at length. The patient/family was given strict return precautions who verbalized understanding of the instructions. No further questions at time of discharge.  Disposition: Discharge  Condition: Good  ED Discharge Orders     None        Follow Up: Berniece Salines, FNP [address] [place] [address] [phone]  Call  to schedule an appointment for close follow up           This chart was dictated using voice recognition software.  Despite best efforts to proofread,  errors can occur which can change the documentation meaning.    Nira Conn, MD 07/07/22 [phone]

## 2022-12-08 ENCOUNTER — Ambulatory Visit (INDEPENDENT_AMBULATORY_CARE_PROVIDER_SITE_OTHER): Payer: 59 | Admitting: Nurse Practitioner

## 2022-12-08 ENCOUNTER — Encounter: Payer: Self-pay | Admitting: Oncology

## 2022-12-08 ENCOUNTER — Other Ambulatory Visit: Payer: Self-pay

## 2022-12-08 ENCOUNTER — Encounter: Payer: Self-pay | Admitting: Nurse Practitioner

## 2022-12-08 VITALS — BP 132/84 | HR 75 | Temp 97.9°F | Resp 16 | Ht 74.0 in | Wt 272.1 lb

## 2022-12-08 DIAGNOSIS — D500 Iron deficiency anemia secondary to blood loss (chronic): Secondary | ICD-10-CM | POA: Diagnosis not present

## 2022-12-08 DIAGNOSIS — R7303 Prediabetes: Secondary | ICD-10-CM | POA: Diagnosis not present

## 2022-12-08 DIAGNOSIS — L03119 Cellulitis of unspecified part of limb: Secondary | ICD-10-CM

## 2022-12-08 DIAGNOSIS — I10 Essential (primary) hypertension: Secondary | ICD-10-CM

## 2022-12-08 DIAGNOSIS — Z1159 Encounter for screening for other viral diseases: Secondary | ICD-10-CM

## 2022-12-08 DIAGNOSIS — Z114 Encounter for screening for human immunodeficiency virus [HIV]: Secondary | ICD-10-CM | POA: Diagnosis not present

## 2022-12-08 MED ORDER — DOXYCYCLINE HYCLATE 100 MG PO TABS
100.0000 mg | ORAL_TABLET | Freq: Two times a day (BID) | ORAL | 0 refills | Status: AC
  Filled 2022-12-08: qty 14, 7d supply, fill #0

## 2022-12-08 NOTE — Assessment & Plan Note (Signed)
No longer taking blood pressure medication, reports she is doing well without it. She does report she still has some leg swelling.  Will get labs and address later.

## 2022-12-08 NOTE — Progress Notes (Signed)
BP 132/84   Pulse 75   Temp 97.9 F (36.6 C) (Oral)   Resp 16   Ht 6\' 2"  (1.88 m)   Wt 272 lb 1.6 oz (123.4 kg)   LMP 04/18/2019 (Approximate) Comment: 9 weeks ongoing menses  SpO2 98%   BMI 34.94 kg/m    Subjective:    Patient ID: Krista Garza, female    DOB: 1975-12-21, 47 y.o.   MRN: [id number]  HPI: Krista Garza is a 47 y.o. female  Chief Complaint  Patient presents with   Foot Injury    Right foot, bites from red ants. Had blisters that burst now infected   Obesity   Hypertension   Hypertension:  -Medications: she was taking amlodipine, she is not taking it anymore, she is now taking supplements and is feeling much better.  -Patient is not compliant with above medications  -Checking BP at home (average): 130s -Denies any SOB, CP, vision changes, LE edema or symptoms of hypotension -Diet: recommend DASH diet  -Exercise: recommend 150 min of physical activity weekly     Prediabetic: last A1C was 02/19/2022 and was 6.1. denies polyuria, polydipsia or polyphagia. Will get labs today.   Obesity:  Current weight : 272 lbs BMI: 34.94 Treatment Tried: lifestyle modification Comorbidities: HTN, prediabetes  Skin infection: patient stepped into an ant bed about a couple months ago. She says she developed blisters and now her foot is infected. She says her son has been squeezing it and cleaning it with hydrogen peroxide. She reports her foot has been swollen the last couple of weeks.   Will start her on doxycycline. Picture in media.    Relevant past medical, surgical, family and social history reviewed and updated as indicated. Interim medical history since our last visit reviewed. Allergies and medications reviewed and updated.  Review of Systems  Constitutional: Negative for fever or weight change.  Respiratory: Negative for cough and shortness of breath.   Cardiovascular: Negative for chest pain or palpitations.  Gastrointestinal: Negative for abdominal pain,  no bowel changes.  Musculoskeletal: Negative for gait problem or joint swelling.  Skin: Negative for rash.  Right foot: swelling and drainage Neurological: Negative for dizziness or headache.  No other specific complaints in a complete review of systems (except as listed in HPI above).      Objective:    BP 132/84   Pulse 75   Temp 97.9 F (36.6 C) (Oral)   Resp 16   Ht 6\' 2"  (1.88 m)   Wt 272 lb 1.6 oz (123.4 kg)   LMP 04/18/2019 (Approximate) Comment: 9 weeks ongoing menses  SpO2 98%   BMI 34.94 kg/m   Wt Readings from Last 3 Encounters:  12/08/22 272 lb 1.6 oz (123.4 kg)  07/06/22 254 lb (115.2 kg)  04/09/22 259 lb 3.2 oz (117.6 kg)    Physical Exam  Constitutional: Patient appears well-developed and well-nourished. Obese  No distress.  HEENT: head atraumatic, normocephalic, pupils equal and reactive to light, neck supple, throat within normal limits Cardiovascular: Normal rate, regular rhythm and normal heart sounds.  No murmur heard. No BLE edema. Pulmonary/Chest: Effort normal and breath sounds normal. No respiratory distress. Abdominal: Soft.  There is no tenderness. Right foot: swelling around great toe and in between 4th and 5 th toe Psychiatric: Patient has a normal mood and affect. behavior is normal. Judgment and thought content normal.  Results for orders placed or performed during the hospital encounter of 07/06/22  Basic metabolic  panel  Result Value Ref Range   Sodium 137 135 - 145 mmol/L   Potassium 3.6 3.5 - 5.1 mmol/L   Chloride 101 98 - 111 mmol/L   CO2 24 22 - 32 mmol/L   Glucose, Bld 108 (H) 70 - 99 mg/dL   BUN 14 6 - 20 mg/dL   Creatinine, Ser 1.61 0.44 - 1.00 mg/dL   Calcium 9.2 8.9 - 09.6 mg/dL   GFR, Estimated >04 >54 mL/min   Anion gap 12 5 - 15  CBC  Result Value Ref Range   WBC 8.7 4.0 - 10.5 K/uL   RBC 5.43 (H) 3.87 - 5.11 MIL/uL   Hemoglobin 14.9 12.0 - 15.0 g/dL   HCT 09.8 (H) 11.9 - 14.7 %   MCV 85.1 80.0 - 100.0 fL   MCH 27.4  26.0 - 34.0 pg   MCHC 32.3 30.0 - 36.0 g/dL   RDW 82.9 56.2 - 13.0 %   Platelets 276 150 - 400 K/uL   nRBC 0.0 0.0 - 0.2 %  Troponin I (High Sensitivity)  Result Value Ref Range   Troponin I (High Sensitivity) 6 <18 ng/L  Troponin I (High Sensitivity)  Result Value Ref Range   Troponin I (High Sensitivity) 5 <18 ng/L      Assessment & Plan:   Problem List Items Addressed This Visit       Cardiovascular and Mediastinum   Primary hypertension - Primary    No longer taking blood pressure medication, reports she is doing well without it. She does report she still has some leg swelling.  Will get labs and address later.       Relevant Orders   CBC with Differential/Platelet   COMPLETE METABOLIC PANEL WITH GFR     Other   Iron deficiency anemia due to chronic blood loss    Checking labs      Relevant Orders   CBC with Differential/Platelet   Lipid panel   Other Visit Diagnoses     Prediabetes       checking labs   Relevant Orders   COMPLETE METABOLIC PANEL WITH GFR   Hemoglobin A1c   Screening for HIV without presence of risk factors       Relevant Orders   HIV Antibody (routine testing w rflx)   Encounter for hepatitis C screening test for low risk patient       Relevant Orders   Hepatitis C antibody   Cellulitis of foot       start doxycyline, warm compresses,  wound culture collected   Relevant Medications   doxycycline (VIBRA-TABS) 100 MG tablet   Other Relevant Orders   Wound culture        Follow up plan: Return if symptoms worsen or fail to improve.

## 2022-12-08 NOTE — Assessment & Plan Note (Signed)
Checking labs

## 2022-12-09 LAB — LIPID PANEL
Cholesterol: 154 mg/dL (ref <200)
HDL: 49 mg/dL — ABNORMAL LOW (ref >=50)
LDL Cholesterol (Calc): 82 mg/dL
Non-HDL Cholesterol (Calc): 105 mg/dL (ref <130)
Total CHOL/HDL Ratio: 3.1 (calc) (ref <5.0)
Triglycerides: 126 mg/dL (ref <150)

## 2022-12-09 LAB — COMPLETE METABOLIC PANEL WITH GFR
AG Ratio: 1.4 (calc) (ref 1.0–2.5)
ALT: 12 U/L (ref 6–29)
AST: 11 U/L (ref 10–35)
Albumin: 4.3 g/dL (ref 3.6–5.1)
Alkaline phosphatase (APISO): 60 U/L (ref 31–125)
BUN: 17 mg/dL (ref 7–25)
CO2: 27 mmol/L (ref 20–32)
Calcium: 9.5 mg/dL (ref 8.6–10.2)
Chloride: 104 mmol/L (ref 98–110)
Creat: 0.72 mg/dL (ref 0.50–0.99)
Globulin: 3 g/dL (ref 1.9–3.7)
Glucose, Bld: 88 mg/dL (ref 65–99)
Potassium: 4 mmol/L (ref 3.5–5.3)
Sodium: 138 mmol/L (ref 135–146)
Total Bilirubin: 0.3 mg/dL (ref 0.2–1.2)
Total Protein: 7.3 g/dL (ref 6.1–8.1)
eGFR: 102 mL/min/{1.73_m2} (ref >=60)

## 2022-12-09 LAB — CBC WITH DIFFERENTIAL/PLATELET
Absolute Monocytes: 640 {cells}/uL (ref 200–950)
Basophils Absolute: 40 {cells}/uL (ref 0–200)
Basophils Relative: 0.6 %
Eosinophils Absolute: 251 cells/uL (ref 15–500)
Eosinophils Relative: 3.8 %
HCT: 46 % — ABNORMAL HIGH (ref 35.0–45.0)
Hemoglobin: 14.9 g/dL (ref 11.7–15.5)
Lymphs Abs: 1822 {cells}/uL (ref 850–3900)
MCH: 27.3 pg (ref 27.0–33.0)
MCHC: 32.4 g/dL (ref 32.0–36.0)
MCV: 84.2 fL (ref 80.0–100.0)
MPV: 10.7 fL (ref 7.5–12.5)
Monocytes Relative: 9.7 %
Neutro Abs: 3848 {cells}/uL (ref 1500–7800)
Neutrophils Relative %: 58.3 %
Platelets: 321 10*3/uL (ref 140–400)
RBC: 5.46 10*6/uL — ABNORMAL HIGH (ref 3.80–5.10)
RDW: 13 % (ref 11.0–15.0)
Total Lymphocyte: 27.6 %
WBC: 6.6 10*3/uL (ref 3.8–10.8)

## 2022-12-09 LAB — HIV ANTIBODY (ROUTINE TESTING W REFLEX): HIV 1&2 Ab, 4th Generation: NONREACTIVE

## 2022-12-09 LAB — HEMOGLOBIN A1C
Hgb A1c MFr Bld: 6.2 %{Hb} — ABNORMAL HIGH (ref <5.7)
Mean Plasma Glucose: 131 mg/dL
eAG (mmol/L): 7.3 mmol/L

## 2022-12-09 LAB — HEPATITIS C ANTIBODY: Hepatitis C Ab: NONREACTIVE

## 2022-12-11 ENCOUNTER — Encounter: Payer: Self-pay | Admitting: Nurse Practitioner

## 2022-12-11 LAB — WOUND CULTURE
MICRO NUMBER:: 15473160
SPECIMEN QUALITY:: ADEQUATE

## 2022-12-15 ENCOUNTER — Other Ambulatory Visit: Payer: Self-pay | Admitting: Nurse Practitioner

## 2022-12-15 ENCOUNTER — Other Ambulatory Visit: Payer: Self-pay

## 2022-12-15 DIAGNOSIS — L299 Pruritus, unspecified: Secondary | ICD-10-CM

## 2022-12-15 MED ORDER — HYDROXYZINE PAMOATE 25 MG PO CAPS
25.0000 mg | ORAL_CAPSULE | Freq: Every evening | ORAL | 0 refills | Status: DC | PRN
  Filled 2022-12-15: qty 30, 30d supply, fill #0

## 2022-12-15 MED ORDER — HYDROXYZINE PAMOATE 25 MG PO CAPS
25.0000 mg | ORAL_CAPSULE | Freq: Three times a day (TID) | ORAL | 0 refills | Status: DC | PRN
  Filled 2022-12-15: qty 30, 10d supply, fill #0

## 2022-12-15 MED ORDER — TRIAMCINOLONE ACETONIDE 0.5 % EX OINT
1.0000 | TOPICAL_OINTMENT | Freq: Two times a day (BID) | CUTANEOUS | 0 refills | Status: DC
  Filled 2022-12-15: qty 30, 15d supply, fill #0

## 2023-01-03 ENCOUNTER — Other Ambulatory Visit: Payer: Self-pay

## 2023-01-03 ENCOUNTER — Encounter: Payer: Self-pay | Admitting: Emergency Medicine

## 2023-01-03 ENCOUNTER — Encounter: Payer: Self-pay | Admitting: Oncology

## 2023-01-03 ENCOUNTER — Emergency Department
Admission: EM | Admit: 2023-01-03 | Discharge: 2023-01-03 | Disposition: A | Discharge status: Home / Self Care | Payer: Worker's Compensation | Attending: Emergency Medicine | Admitting: Emergency Medicine

## 2023-01-03 DIAGNOSIS — I10 Essential (primary) hypertension: Secondary | ICD-10-CM | POA: Insufficient documentation

## 2023-01-03 DIAGNOSIS — Y990 Civilian activity done for income or pay: Secondary | ICD-10-CM | POA: Diagnosis not present

## 2023-01-03 DIAGNOSIS — S0990XA Unspecified injury of head, initial encounter: Secondary | ICD-10-CM | POA: Insufficient documentation

## 2023-01-03 DIAGNOSIS — T7411XA Adult physical abuse, confirmed, initial encounter: Secondary | ICD-10-CM | POA: Diagnosis not present
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
## 2023-01-22 ENCOUNTER — Ambulatory Visit (INDEPENDENT_AMBULATORY_CARE_PROVIDER_SITE_OTHER): Payer: 59 | Admitting: Nurse Practitioner

## 2023-01-22 ENCOUNTER — Encounter: Payer: Self-pay | Admitting: Nurse Practitioner

## 2023-01-22 ENCOUNTER — Other Ambulatory Visit: Payer: Self-pay

## 2023-01-22 VITALS — BP 124/82 | HR 78 | Temp 98.0°F | Resp 16 | Ht 74.0 in | Wt 268.9 lb

## 2023-01-22 DIAGNOSIS — R0989 Other specified symptoms and signs involving the circulatory and respiratory systems: Secondary | ICD-10-CM | POA: Diagnosis not present

## 2023-01-22 DIAGNOSIS — R071 Chest pain on breathing: Secondary | ICD-10-CM | POA: Diagnosis not present

## 2023-01-22 DIAGNOSIS — M940 Chondrocostal junction syndrome [Tietze]: Secondary | ICD-10-CM | POA: Diagnosis not present

## 2023-01-22 DIAGNOSIS — R051 Acute cough: Secondary | ICD-10-CM | POA: Diagnosis not present

## 2023-01-22 DIAGNOSIS — R079 Chest pain, unspecified: Secondary | ICD-10-CM | POA: Diagnosis not present

## 2023-01-22 DIAGNOSIS — Z90710 Acquired absence of both cervix and uterus: Secondary | ICD-10-CM | POA: Diagnosis not present

## 2023-01-22 DIAGNOSIS — Z1152 Encounter for screening for COVID-19: Secondary | ICD-10-CM | POA: Diagnosis not present

## 2023-01-22 MED ORDER — PROMETHAZINE-DM 6.25-15 MG/5ML PO SYRP
5.0000 mL | ORAL_SOLUTION | Freq: Four times a day (QID) | ORAL | 0 refills | Status: DC | PRN
  Filled 2023-01-22: qty 118, 6d supply, fill #0

## 2023-01-22 MED ORDER — BENZONATATE 100 MG PO CAPS
200.0000 mg | ORAL_CAPSULE | Freq: Two times a day (BID) | ORAL | 0 refills | Status: DC | PRN
  Filled 2023-01-22: qty 20, 5d supply, fill #0

## 2023-01-22 MED ORDER — METHYLPREDNISOLONE 4 MG PO TBPK
ORAL_TABLET | ORAL | 0 refills | Status: DC
  Filled 2023-01-22: qty 21, 6d supply, fill #0

## 2023-01-22 MED ORDER — ALBUTEROL SULFATE HFA 108 (90 BASE) MCG/ACT IN AERS
2.0000 | INHALATION_SPRAY | Freq: Four times a day (QID) | RESPIRATORY_TRACT | 0 refills | Status: DC | PRN
  Filled 2023-01-22: qty 6.7, 25d supply, fill #0

## 2023-01-22 NOTE — Addendum Note (Signed)
Addended by: Della Goo F on: 01/22/2023 03:07 PM   Modules accepted: Orders

## 2023-01-22 NOTE — Progress Notes (Signed)
BP 124/82   Pulse 78   Temp 98 F (36.7 C) (Oral)   Resp 16   Ht 6\' 2"  (1.88 m)   Wt 268 lb 14.4 oz (122 kg)   LMP 04/18/2019 (Approximate) Comment: 9 weeks ongoing menses  SpO2 97%   BMI 34.52 kg/m    Subjective:    Patient ID: Krista Garza, female    DOB: [birth year], 47 y.o.   MRN: [id number]  HPI: Krista Garza is a 47 y.o. female  Chief Complaint  Patient presents with   Cough    For 4 days   URI Compliant: symptoms started 4 days ago -Fever: no -Cough: yes -Shortness of breath: yes with exertion -Wheezing: yes -Chest congestion: yes -Nasal congestion: no -Runny nose: no -Post nasal drip: no -Sore throat: no -Sinus pressure: no -Headache: no -Face pain: no -Ear pain:  no -Ear pressure: no -Relief with OTC cold/cough medications: mucinex day and night  Will get covid PCR.  Recommend taking zyrtec, flonase, mucinex, vitamin d, vitamin c, and zinc. Push fluids and get rest.     Relevant past medical, surgical, family and social history reviewed and updated as indicated. Interim medical history since our last visit reviewed. Allergies and medications reviewed and updated.  Review of Systems  Ten systems reviewed and is negative except as mentioned in HPI       Objective:    BP 124/82   Pulse 78   Temp 98 F (36.7 C) (Oral)   Resp 16   Ht 6\' 2"  (1.88 m)   Wt 268 lb 14.4 oz (122 kg)   LMP 04/18/2019 (Approximate) Comment: 9 weeks ongoing menses  SpO2 97%   BMI 34.52 kg/m   Wt Readings from Last 3 Encounters:  01/22/23 268 lb 14.4 oz (122 kg)  12/08/22 272 lb 1.6 oz (123.4 kg)  07/06/22 254 lb (115.2 kg)    Physical Exam  Constitutional: Patient appears well-developed and well-nourished. Obese  No distress.  HEENT: head atraumatic, normocephalic, pupils equal and reactive to light, ears TMs clear, neck supple, throat within normal limits Cardiovascular: Normal rate, regular rhythm and normal heart sounds.  No murmur heard. No BLE  edema. Pulmonary/Chest: Effort normal and breath sounds minimal wheeze. No respiratory distress. Abdominal: Soft.  There is no tenderness. Psychiatric: Patient has a normal mood and affect. behavior is normal. Judgment and thought content normal.   Results for orders placed or performed in visit on 12/08/22  Wound culture   Specimen: Wound  Result Value Ref Range   MICRO NUMBER: [id number]    SPECIMEN QUALITY: Adequate    SOURCE: WOUND (SITE NOT SPECIFIED)    STATUS: FINAL    GRAM STAIN:      Rare epithelial cells No white blood cells seen Moderate Gram positive cocci in clusters Few Gram positive cocci in pairs   ISOLATE 1: Staphylococcus aureus (A)       Susceptibility   Staphylococcus aureus - AEROBIC CULT, GRAM STAIN POSITIVE 1    VANCOMYCIN <=0.5 Sensitive     CIPROFLOXACIN >=8 Resistant     CLINDAMYCIN NR Resistant     LEVOFLOXACIN 4 Intermediate     ERYTHROMYCIN 1 Intermediate     GENTAMICIN <=0.5 Sensitive     OXACILLIN* 0.5 Sensitive      * Oxacillin susceptible staphylococci are susceptible to other penicillinase-stable penicillins (e.g., methicillin, nafcillin), beta- lactam/beta-lactamase inhibitor combinations, and cephems with staphylococcal indications, including cefazolin.     TETRACYCLINE <=1 Sensitive  TRIMETH/SULFA* <=10 Sensitive      * Oxacillin susceptible staphylococci are susceptible to other penicillinase-stable penicillins (e.g., methicillin, nafcillin), beta- lactam/beta-lactamase inhibitor combinations, and cephems with staphylococcal indications, including cefazolin. Legend: S = Susceptible  I = Intermediate R = Resistant  NS = Not susceptible SDD = Susceptible Dose Dependent * = Not Tested  NR = Not Reported **NN = See Therapy Comments   CBC with Differential/Platelet  Result Value Ref Range   WBC 6.6 3.8 - 10.8 Thousand/uL   RBC 5.46 (H) 3.80 - 5.10 Million/uL   Hemoglobin 14.9 11.7 - 15.5 g/dL   HCT 19.1 (H) 47.8 - 29.5 %   MCV  84.2 80.0 - 100.0 fL   MCH 27.3 27.0 - 33.0 pg   MCHC 32.4 32.0 - 36.0 g/dL   RDW 62.1 30.8 - 65.7 %   Platelets 321 140 - 400 Thousand/uL   MPV 10.7 7.5 - 12.5 fL   Neutro Abs 3,848 1,500 - 7,800 cells/uL   Lymphs Abs 1,822 850 - 3,900 cells/uL   Absolute Monocytes 640 200 - 950 cells/uL   Eosinophils Absolute 251 15 - 500 cells/uL   Basophils Absolute 40 0 - 200 cells/uL   Neutrophils Relative % 58.3 %   Total Lymphocyte 27.6 %   Monocytes Relative 9.7 %   Eosinophils Relative 3.8 %   Basophils Relative 0.6 %  COMPLETE METABOLIC PANEL WITH GFR  Result Value Ref Range   Glucose, Bld 88 65 - 99 mg/dL   BUN 17 7 - 25 mg/dL   Creat 8.46 9.62 - 9.52 mg/dL   eGFR 841 > OR = 60 LK/GMW/1.02V2   BUN/Creatinine Ratio SEE NOTE: 6 - 22 (calc)   Sodium 138 135 - 146 mmol/L   Potassium 4.0 3.5 - 5.3 mmol/L   Chloride 104 98 - 110 mmol/L   CO2 27 20 - 32 mmol/L   Calcium 9.5 8.6 - 10.2 mg/dL   Total Protein 7.3 6.1 - 8.1 g/dL   Albumin 4.3 3.6 - 5.1 g/dL   Globulin 3.0 1.9 - 3.7 g/dL (calc)   AG Ratio 1.4 1.0 - 2.5 (calc)   Total Bilirubin 0.3 0.2 - 1.2 mg/dL   Alkaline phosphatase (APISO) 60 31 - 125 U/L   AST 11 10 - 35 U/L   ALT 12 6 - 29 U/L  Lipid panel  Result Value Ref Range   Cholesterol 154 <200 mg/dL   HDL 49 (L) > OR = 50 mg/dL   Triglycerides 536 <644 mg/dL   LDL Cholesterol (Calc) 82 mg/dL (calc)   Total CHOL/HDL Ratio 3.1 <5.0 (calc)   Non-HDL Cholesterol (Calc) 105 <130 mg/dL (calc)  Hemoglobin I3K  Result Value Ref Range   Hgb A1c MFr Bld 6.2 (H) <5.7 % of total Hgb   Mean Plasma Glucose 131 mg/dL   eAG (mmol/L) 7.3 mmol/L  Hepatitis C antibody  Result Value Ref Range   Hepatitis C Ab NON-REACTIVE NON-REACTIVE  HIV Antibody (routine testing w rflx)  Result Value Ref Range   HIV 1&2 Ab, 4th Generation NON-REACTIVE NON-REACTIVE      Assessment & Plan:   Problem List Items Addressed This Visit   None Visit Diagnoses     Acute cough    -  Primary    Recommend taking zyrtec, flonase, mucinex. Push fluids and get rest.   sent in albuterol, medrol dose pack, phenergen-dm and tessalon perls, covid test pending   Relevant Medications   albuterol (VENTOLIN HFA) 108 (90 Base)  MCG/ACT inhaler   methylPREDNISolone (MEDROL DOSEPAK) 4 MG TBPK tablet   promethazine-dextromethorphan (PROMETHAZINE-DM) 6.25-15 MG/5ML syrup   benzonatate (TESSALON) 100 MG capsule   Other Relevant Orders   Novel Coronavirus, NAA ([facility])        Follow up plan: Return if symptoms worsen or fail to improve.

## 2023-01-23 DIAGNOSIS — R071 Chest pain on breathing: Secondary | ICD-10-CM | POA: Diagnosis not present

## 2023-01-23 DIAGNOSIS — R051 Acute cough: Secondary | ICD-10-CM | POA: Diagnosis not present

## 2023-01-26 LAB — SPECIMEN STATUS REPORT

## 2023-01-26 LAB — NOVEL CORONAVIRUS, NAA: SARS-CoV-2, NAA: NOT DETECTED

## 2023-01-27 ENCOUNTER — Other Ambulatory Visit: Payer: Self-pay | Admitting: Nurse Practitioner

## 2023-01-27 ENCOUNTER — Encounter: Payer: Self-pay | Admitting: Nurse Practitioner

## 2023-01-27 ENCOUNTER — Other Ambulatory Visit: Payer: Self-pay

## 2023-01-27 DIAGNOSIS — J189 Pneumonia, unspecified organism: Secondary | ICD-10-CM

## 2023-01-27 MED ORDER — AZITHROMYCIN 250 MG PO TABS
ORAL_TABLET | ORAL | 0 refills | Status: AC
  Filled 2023-01-27: qty 6, 5d supply, fill #0

## 2023-02-02 ENCOUNTER — Encounter: Payer: Self-pay | Admitting: Nurse Practitioner

## 2023-02-02 ENCOUNTER — Ambulatory Visit
Admission: RE | Admit: 2023-02-02 | Discharge: 2023-02-02 | Disposition: A | Discharge status: Home / Self Care | Payer: 59 | Source: Ambulatory Visit | Attending: Nurse Practitioner

## 2023-02-02 ENCOUNTER — Ambulatory Visit
Admission: RE | Admit: 2023-02-02 | Discharge: 2023-02-02 | Disposition: A | Discharge status: Home / Self Care | Payer: 59 | Attending: Nurse Practitioner | Admitting: Nurse Practitioner

## 2023-02-02 ENCOUNTER — Other Ambulatory Visit: Payer: Self-pay | Admitting: Nurse Practitioner

## 2023-02-02 DIAGNOSIS — J189 Pneumonia, unspecified organism: Secondary | ICD-10-CM | POA: Insufficient documentation

## 2023-02-02 DIAGNOSIS — R052 Subacute cough: Secondary | ICD-10-CM

## 2023-02-02 DIAGNOSIS — R059 Cough, unspecified: Secondary | ICD-10-CM | POA: Diagnosis not present

## 2023-02-04 ENCOUNTER — Other Ambulatory Visit: Payer: Self-pay

## 2023-02-04 ENCOUNTER — Other Ambulatory Visit: Payer: Self-pay | Admitting: Nurse Practitioner

## 2023-02-04 ENCOUNTER — Encounter: Payer: Self-pay | Admitting: Nurse Practitioner

## 2023-02-04 DIAGNOSIS — J189 Pneumonia, unspecified organism: Secondary | ICD-10-CM

## 2023-02-04 MED ORDER — FLUTICASONE-SALMETEROL 100-50 MCG/ACT IN AEPB
1.0000 | INHALATION_SPRAY | Freq: Two times a day (BID) | RESPIRATORY_TRACT | 0 refills | Status: DC
  Filled 2023-02-04: qty 60, 30d supply, fill #0

## 2023-02-04 MED ORDER — DOXYCYCLINE HYCLATE 100 MG PO TABS
100.0000 mg | ORAL_TABLET | Freq: Two times a day (BID) | ORAL | 0 refills | Status: AC
  Filled 2023-02-04: qty 10, 5d supply, fill #0

## 2023-04-12 ENCOUNTER — Emergency Department
Admission: EM | Admit: 2023-04-12 | Discharge: 2023-04-12 | Disposition: A | Discharge status: Home / Self Care | Payer: Worker's Compensation | Attending: Emergency Medicine | Admitting: Emergency Medicine

## 2023-04-12 ENCOUNTER — Encounter: Payer: Self-pay | Admitting: Oncology

## 2023-04-12 ENCOUNTER — Other Ambulatory Visit: Payer: Self-pay

## 2023-04-12 DIAGNOSIS — S60221A Contusion of right hand, initial encounter: Secondary | ICD-10-CM | POA: Diagnosis not present

## 2023-04-12 DIAGNOSIS — I10 Essential (primary) hypertension: Secondary | ICD-10-CM | POA: Insufficient documentation

## 2023-04-12 DIAGNOSIS — Y990 Civilian activity done for income or pay: Secondary | ICD-10-CM | POA: Insufficient documentation

## 2023-04-12 DIAGNOSIS — S6991XA Unspecified injury of right wrist, hand and finger(s), initial encounter: Secondary | ICD-10-CM | POA: Diagnosis present

## 2023-04-12 NOTE — ED Notes (Signed)
WC performed and taken to the lab.
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
## 2023-04-12 NOTE — ED Notes (Signed)
Discharge instructions reviewed with patient. Patient questions answered and opportunity for education reviewed. Patient voices understanding of discharge instructions with no further questions. Patient ambulatory with steady gait to lobby.  
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
## 2023-05-08 ENCOUNTER — Encounter: Payer: Self-pay | Admitting: Oncology

## 2023-05-08 ENCOUNTER — Other Ambulatory Visit: Payer: Self-pay

## 2023-05-08 MED ORDER — AMOXICILLIN 500 MG PO CAPS
500.0000 mg | ORAL_CAPSULE | Freq: Three times a day (TID) | ORAL | 0 refills | Status: AC
  Filled 2023-05-08: qty 21, 7d supply, fill #0

## 2023-05-11 ENCOUNTER — Other Ambulatory Visit: Payer: Self-pay

## 2023-05-11 MED ORDER — METHYLPREDNISOLONE 4 MG PO TBPK
ORAL_TABLET | ORAL | 0 refills | Status: AC
  Filled 2023-05-11: qty 21, 6d supply, fill #0

## 2023-05-26 ENCOUNTER — Encounter: Payer: Self-pay | Admitting: Oncology

## 2023-06-23 ENCOUNTER — Encounter: Payer: Self-pay | Admitting: Oncology

## 2023-10-08 DIAGNOSIS — M47817 Spondylosis without myelopathy or radiculopathy, lumbosacral region: Secondary | ICD-10-CM | POA: Diagnosis not present

## 2023-10-08 DIAGNOSIS — I10 Essential (primary) hypertension: Secondary | ICD-10-CM | POA: Diagnosis not present

## 2023-10-08 DIAGNOSIS — M25551 Pain in right hip: Secondary | ICD-10-CM | POA: Diagnosis not present

## 2023-10-08 DIAGNOSIS — M5441 Lumbago with sciatica, right side: Secondary | ICD-10-CM | POA: Diagnosis not present

## 2023-10-09 ENCOUNTER — Other Ambulatory Visit: Payer: Self-pay

## 2023-10-09 MED ORDER — PREDNISONE 20 MG PO TABS
40.0000 mg | ORAL_TABLET | Freq: Every day | ORAL | 0 refills | Status: DC
  Filled 2023-10-09: qty 10, 8d supply, fill #0

## 2023-10-09 MED ORDER — CYCLOBENZAPRINE HCL 10 MG PO TABS
10.0000 mg | ORAL_TABLET | Freq: Two times a day (BID) | ORAL | 0 refills | Status: DC
  Filled 2023-10-09: qty 20, 10d supply, fill #0

## 2023-10-09 MED ORDER — NAPROXEN 500 MG PO TABS
500.0000 mg | ORAL_TABLET | Freq: Two times a day (BID) | ORAL | 0 refills | Status: DC
  Filled 2023-10-09: qty 30, 15d supply, fill #0

## 2023-12-18 ENCOUNTER — Other Ambulatory Visit: Payer: Self-pay

## 2023-12-18 MED ORDER — CLINDAMYCIN HCL 300 MG PO CAPS
ORAL_CAPSULE | ORAL | 0 refills | Status: AC
  Filled 2023-12-18: qty 18, 7d supply, fill #0

## 2023-12-18 MED ORDER — METHYLPREDNISOLONE 4 MG PO TBPK
ORAL_TABLET | ORAL | 0 refills | Status: DC
  Filled 2023-12-18: qty 21, 6d supply, fill #0

## 2023-12-31 ENCOUNTER — Ambulatory Visit (INDEPENDENT_AMBULATORY_CARE_PROVIDER_SITE_OTHER): Admitting: Nurse Practitioner

## 2023-12-31 ENCOUNTER — Encounter: Payer: Self-pay | Admitting: Nurse Practitioner

## 2023-12-31 VITALS — BP 118/70 | HR 76 | Resp 16 | Ht 75.0 in | Wt 273.0 lb

## 2023-12-31 DIAGNOSIS — Z0000 Encounter for general adult medical examination without abnormal findings: Secondary | ICD-10-CM

## 2023-12-31 DIAGNOSIS — R4189 Other symptoms and signs involving cognitive functions and awareness: Secondary | ICD-10-CM | POA: Diagnosis not present

## 2023-12-31 DIAGNOSIS — G4733 Obstructive sleep apnea (adult) (pediatric): Secondary | ICD-10-CM | POA: Diagnosis not present

## 2023-12-31 DIAGNOSIS — D500 Iron deficiency anemia secondary to blood loss (chronic): Secondary | ICD-10-CM

## 2023-12-31 DIAGNOSIS — R7303 Prediabetes: Secondary | ICD-10-CM

## 2023-12-31 DIAGNOSIS — I10 Essential (primary) hypertension: Secondary | ICD-10-CM

## 2023-12-31 DIAGNOSIS — Z1231 Encounter for screening mammogram for malignant neoplasm of breast: Secondary | ICD-10-CM

## 2023-12-31 DIAGNOSIS — H539 Unspecified visual disturbance: Secondary | ICD-10-CM | POA: Diagnosis not present

## 2023-12-31 NOTE — Assessment & Plan Note (Signed)
 Checking microalbumin/creatinine ratio.

## 2023-12-31 NOTE — Progress Notes (Addendum)
 Name: Krista Garza   MRN: [id number]    DOB: [birth year]   Date:12/31/2023       Progress Note  Subjective  Chief Complaint  Annual Wellness Exam and bilateral ankle swelling, blurry vision, and brain fog that has been going on for several months.    HPI  Patient presents for annual CPE and bilateral ankle swelling, blurry vision, and brain fog that has been going on for several months.   Diet: Reports smaller portions at each meal, incorporating proteins, fruits, and vegetables.  Exercise: Reports getting on the eliptical 2-3 times a week   Sleep: 6-7 hours/night  Last dental exam:last month  Last eye exam: needs to schedule    Bilateral ankle swelling She denies any recent injury to ankles. She endorses standing for long periods on her feet at her job. No pain reported.  Blurry Vision -Reports this is not a new symptom for her. She describes her vision as cloudy. She is interested in seeing an ophthalmologist.   Brain fog -Patient endorses some brain fogginess. She is unsure if it's age related or if there could be something else going on.   Flowsheet Row Office Visit from 12/31/2023 in [facility]  AUDIT-C Score 0   Depression: Phq 9 is  negative    12/31/2023    1:37 PM 01/22/2023    2:52 PM 12/08/2022    2:08 PM 04/09/2022    1:33 PM 02/19/2022    9:18 AM  Depression screen PHQ 2/9  Decreased Interest 0 0 1 0 0  Down, Depressed, Hopeless 0 0 0 0 0  PHQ - 2 Score 0 0 1 0 0  Altered sleeping     0  Tired, decreased energy     0  Change in appetite     0  Feeling bad or failure about yourself      0  Trouble concentrating     0  Moving slowly or fidgety/restless     0  Suicidal thoughts     0  PHQ-9 Score     0  Difficult doing work/chores     Not difficult at all   Hypertension: BP Readings from Last 3 Encounters:  12/31/23 118/70  01/22/23 124/82  12/08/22 132/84   Obesity: Wt Readings from Last 3 Encounters:  12/31/23 273  lb (123.8 kg)  01/22/23 268 lb 14.4 oz (122 kg)  12/08/22 272 lb 1.6 oz (123.4 kg)   BMI Readings from Last 3 Encounters:  12/31/23 34.12 kg/m  01/22/23 34.52 kg/m  12/08/22 34.94 kg/m     Vaccines:  HPV: up to at age 57 , ask insurance if age between 53-45  Shingrix: 56-64 yo and ask insurance if covered when patient above 1 yo Pneumonia:  educated and discussed with patient. Flu: educated and discussed with patient.  Hep C Screening: 12/08/2022 STD testing and prevention (HIV/chl/gon/syphilis): 12/08/2022 Intimate partner violence: no Sexual History : no  Menstrual History/LMP/Abnormal Bleeding: partial hysterectomy  Incontinence Symptoms: no   Breast cancer:  - Last Mammogram: order placed  - BRCA gene screening: Does not qualify   Osteoporosis: Discussed high calcium and vitamin D supplementation, weight bearing exercises  Cervical cancer screening: completed   Skin cancer: Discussed monitoring for atypical lesions  Colorectal cancer: 12/29/2020  Lung cancer:  Does not qualify. Low Dose CT Chest recommended if Age 76-80 years, 20 pack-year currently smoking OR have quit w/in 15years. Patient does not qualify.   ECG: 07/07/2022  Advanced Care Planning: A voluntary discussion about advance care planning including the explanation and discussion of advance directives.  Discussed health care proxy and Living will, and the patient was able to identify a health care proxy as nobody.  Patient does not have a living will at present time. If patient does have living will, I have requested they bring this to the clinic to be scanned in to their chart.  Lipids: Lab Results  Component Value Date   CHOL 154 12/08/2022   CHOL 153 01/03/2021   Lab Results  Component Value Date   HDL 49 (L) 12/08/2022   HDL 53 01/03/2021   Lab Results  Component Value Date   LDLCALC 82 12/08/2022   LDLCALC 76 01/03/2021   Lab Results  Component Value Date   TRIG 126 12/08/2022   TRIG 137  01/03/2021   Lab Results  Component Value Date   CHOLHDL 3.1 12/08/2022   CHOLHDL 2.9 01/03/2021   No results found for: LDLDIRECT  Glucose: Glucose, Bld  Date Value Ref Range Status  12/08/2022 88 65 - 99 mg/dL Final    Comment:    .            Fasting reference interval .   07/06/2022 108 (H) 70 - 99 mg/dL Final    Comment:    Glucose reference range applies only to samples taken after fasting for at least 8 hours.  02/19/2022 94 65 - 99 mg/dL Final    Comment:    .            Fasting reference interval .     Patient Active Problem List   Diagnosis Date Noted   OSA (obstructive sleep apnea) 12/31/2023   Primary hypertension 02/19/2022   Excessive daytime sleepiness 02/05/2021   Colon cancer screening    Endometriosis determined by laparoscopy    Endometrial polyp    Abnormal uterine bleeding    Menorrhagia 05/19/2019   Iron deficiency anemia due to chronic blood loss 03/31/2019    Past Surgical History:  Procedure Laterality Date   CESAREAN SECTION     x2   COLONOSCOPY WITH PROPOFOL  N/A 01/18/2021   Procedure: COLONOSCOPY WITH PROPOFOL ;  Surgeon: Jinny Carmine, MD;  Location: [facility];  Service: Endoscopy;  Laterality: N/A;   CYSTOSCOPY N/A 03/13/2020   Procedure: CYSTOSCOPY;  Surgeon: Victor Claudell SAUNDERS, MD;  Location: [facility];  Service: Gynecology;  Laterality: N/A;   DILATION AND CURETTAGE OF UTERUS     HYSTEROSCOPY WITH D & C N/A 06/21/2019   Procedure: DILATATION AND CURETTAGE /HYSTEROSCOPY, Polypectomy with myosure;  Surgeon: Victor Claudell SAUNDERS, MD;  Location: [facility];  Service: Gynecology;  Laterality: N/A;   TOTAL LAPAROSCOPIC HYSTERECTOMY WITH SALPINGECTOMY Bilateral 03/13/2020   Procedure: TOTAL LAPAROSCOPIC HYSTERECTOMY WITH BILATERAL SALPINGECTOMY;  Surgeon: Victor Claudell SAUNDERS, MD;  Location: [facility];  Service: Gynecology;  Laterality: Bilateral;    Family History  Problem Relation Age of Onset   Diabetes Father     Hyperlipidemia Father    Hypertension Father    Cancer Father 13       colon   Colon cancer Father     Social History   Socioeconomic History   Marital status: Divorced    Spouse name: Not on file   Number of children: 4   Years of education: 12   Highest education level: Associate degree: occupational, Scientist, product/process development, or vocational program  Occupational History   Occupation: Full time    Comment: security  Tobacco Use   Smoking status: Never   Smokeless tobacco: Never  Vaping Use   Vaping status: Never Used  Substance and Sexual Activity   Alcohol use: Not Currently    Comment: margaritas occasionally   Drug use: No   Sexual activity: Not Currently    Birth control/protection: Injection  Other Topics Concern   Not on file  Social History Narrative   Divorced   Does not get regular exercise   Lives with children.    Feels safe in her home.   Works as Office manager at [employer].   Social Drivers of [employer]: Low Risk  (12/31/2023)   Overall Financial Resource Strain (CARDIA)    Difficulty of Paying Living Expenses: Not hard at all  Food Insecurity: No Food Insecurity (12/31/2023)   Hunger Vital Sign    Worried About Running Out of Food in the Last Year: Never true    Ran Out of Food in the Last Year: Never true  Transportation Needs: No Transportation Needs (12/31/2023)   PRAPARE - Administrator, Civil Service (Medical): No    Lack of Transportation (Non-Medical): No  Physical Activity: Insufficiently Active (12/31/2023)   Exercise Vital Sign    Days of Exercise per Week: 2 days    Minutes of Exercise per Session: 60 min  Stress: Stress Concern Present (12/31/2023)   [employer] of Occupational Health - Occupational Stress Questionnaire    Feeling of Stress: Very much  Social Connections: Moderately Isolated (12/31/2023)   Social Connection and Isolation Panel    Frequency of Communication with Friends and Family: More than  three times a week    Frequency of Social Gatherings with Friends and Family: More than three times a week    Attends Religious Services: More than 4 times per year    Active Member of [employer] or Organizations: No    Attends Banker Meetings: Never    Marital Status: Divorced  Catering manager Violence: Not At Risk (12/31/2023)   Humiliation, Afraid, Rape, and Kick questionnaire    Fear of Current or Ex-Partner: No    Emotionally Abused: No    Physically Abused: No    Sexually Abused: No    No current outpatient medications on file.  Allergies  Allergen Reactions   Latex Rash    Adhesive allergy   Adhesive [Tape] Rash    Paper tape okay.  bandaids are a problem.   Mango Flavoring Agent (Non-Screening) Rash   Passion Fruit Flavoring Agent (Non-Screening) Rash   Pineapple Rash     ROS  Constitutional: Negative for fever or weight change.  Respiratory: Negative for cough and shortness of breath.   Cardiovascular: Negative for chest pain or palpitations.  Gastrointestinal: Negative for abdominal pain, no bowel changes.  Musculoskeletal: Negative for gait problem or joint swelling.  Skin: Negative for rash.  Neurological: Negative for dizziness or headache.  No other specific complaints in a complete review of systems (except as listed in HPI above).   Objective  Vitals:   12/31/23 1339  BP: 118/70  Pulse: 76  Resp: 16  SpO2: 98%  Weight: 273 lb (123.8 kg)  Height: 6' 3 (1.905 m)    Body mass index is 34.12 kg/m.  Physical Exam Constitutional:      Appearance: Normal appearance.  HENT:     Head: Normocephalic and atraumatic.     Right Ear: Tympanic membrane normal.     Left Ear: Tympanic  membrane normal.     Nose: Nose normal.     Mouth/Throat:     Mouth: Mucous membranes are moist.  Eyes:     Pupils: Pupils are equal, round, and reactive to light.  Cardiovascular:     Rate and Rhythm: Normal rate and regular rhythm.     Pulses: Normal pulses.      Heart sounds: Normal heart sounds.  Pulmonary:     Effort: Pulmonary effort is normal.     Breath sounds: Normal breath sounds.  Abdominal:     General: Abdomen is flat. Bowel sounds are normal.     Palpations: Abdomen is soft.  Musculoskeletal:        General: Normal range of motion.     Cervical back: Normal range of motion and neck supple.     Right ankle: Swelling present.     Left ankle: Swelling present.  Skin:    General: Skin is warm and dry.  Neurological:     General: No focal deficit present.     Mental Status: She is alert and oriented to person, place, and time.  Psychiatric:        Mood and Affect: Mood normal.        Behavior: Behavior normal.        Thought Content: Thought content normal.    Body mass index is 34.12 kg/m.    No results found for this or any previous visit (from the past 2160 hours).     Fall Risk:    12/31/2023    1:36 PM 01/22/2023    2:52 PM 12/08/2022    2:07 PM 04/09/2022    1:33 PM 02/19/2022    9:17 AM  Fall Risk   Falls in the past year? 0 0 0 0 0  Number falls in past yr: 0 0 0 0 0  Injury with Fall? 0 0 0 0 0  Risk for fall due to : No Fall Risks No Fall Risks No Fall Risks History of fall(s) No Fall Risks  Follow up Falls prevention discussed Falls prevention discussed  Falls evaluation completed  Falls prevention discussed;Education provided;Falls evaluation completed      Data saved with a previous flowsheet row definition     Functional Status Survey: Is the patient deaf or have difficulty hearing?: No Does the patient have difficulty seeing, even when wearing glasses/contacts?: No Does the patient have difficulty concentrating, remembering, or making decisions?: No Does the patient have difficulty walking or climbing stairs?: No Does the patient have difficulty dressing or bathing?: No Does the patient have difficulty doing errands alone such as visiting a doctor's office or shopping?: No   Assessment &  Plan  Problem List Items Addressed This Visit       Cardiovascular and Mediastinum   Primary hypertension   Checking microalbumin/creatinine ratio.      Relevant Orders   Microalbumin / creatinine urine ratio     Respiratory   OSA (obstructive sleep apnea)   Referral placed so patient can discuss getting a CPAP machine instead of the mouth guard.       Relevant Orders   Ambulatory referral to Pulmonology     Other   Iron deficiency anemia due to chronic blood loss   Checking CBC today      Relevant Orders   CBC with Differential/Platelet   Other Visit Diagnoses       Annual physical exam    -  Primary   Checking lab  work   Relevant Orders   CBC with Differential/Platelet   Comprehensive metabolic panel with GFR   Hemoglobin A1c   Lipid panel   TSH   MM 3D SCREENING MAMMOGRAM BILATERAL BREAST   Microalbumin / creatinine urine ratio     Prediabetes       Relevant Orders   Hemoglobin A1c     Changes in vision       Referral to opthamology placed.   Relevant Orders   Ambulatory referral to Ophthalmology     Encounter for screening mammogram for malignant neoplasm of breast       Order for Mammogram placed   Relevant Orders   MM 3D SCREENING MAMMOGRAM BILATERAL BREAST     Brain fog       Ordering labwork   Relevant Orders   CBC with Differential/Platelet   Comprehensive metabolic panel with GFR   TSH   VITAMIN D 25 Hydroxy (Vit-D Deficiency, Fractures)   B12 and Folate Panel   Iron, TIBC and Ferritin Panel        -USPSTF grade A and B recommendations reviewed with patient; age-appropriate recommendations, preventive care, screening tests, etc discussed and encouraged; healthy living encouraged; see AVS for patient education given to patient -Discussed importance of 150 minutes of physical activity weekly, eat two servings of fish weekly, eat one serving of tree nuts ( cashews, pistachios, pecans, almonds.SABRA) every other day, eat 6 servings of  fruit/vegetables daily and drink plenty of water  and avoid sweet beverages.   -Reviewed Health Maintenance: Yes \  I have reviewed this encounter including the documentation in this note and/or discussed this patient with the provider, Aislinn Womack, SNP, I am certifying that I agree with the content of this note as supervising/preceptor nurse practitioner.  Mliss Spray, FNP-C [facility] 12/31/2023, 3:21 PM

## 2023-12-31 NOTE — Assessment & Plan Note (Signed)
 Referral placed so patient can discuss getting a CPAP machine instead of the mouth guard.

## 2023-12-31 NOTE — Assessment & Plan Note (Signed)
 Checking CBC today

## 2024-01-01 ENCOUNTER — Other Ambulatory Visit: Payer: Self-pay | Admitting: Nurse Practitioner

## 2024-01-01 ENCOUNTER — Ambulatory Visit: Payer: Self-pay | Admitting: Nurse Practitioner

## 2024-01-01 DIAGNOSIS — R4189 Other symptoms and signs involving cognitive functions and awareness: Secondary | ICD-10-CM

## 2024-01-02 LAB — COMPREHENSIVE METABOLIC PANEL WITH GFR
AG Ratio: 1.6 (calc) (ref 1.0–2.5)
ALT: 14 U/L (ref 6–29)
AST: 13 U/L (ref 10–35)
Albumin: 4.3 g/dL (ref 3.6–5.1)
Alkaline phosphatase (APISO): 62 U/L (ref 37–153)
BUN: 11 mg/dL (ref 7–25)
CO2: 29 mmol/L (ref 20–32)
Calcium: 9.3 mg/dL (ref 8.6–10.4)
Chloride: 103 mmol/L (ref 98–110)
Creat: 0.7 mg/dL (ref 0.50–1.03)
Globulin: 2.7 g/dL (ref 1.9–3.7)
Glucose, Bld: 91 mg/dL (ref 65–99)
Potassium: 4 mmol/L (ref 3.5–5.3)
Sodium: 138 mmol/L (ref 135–146)
Total Bilirubin: 0.3 mg/dL (ref 0.2–1.2)
Total Protein: 7 g/dL (ref 6.1–8.1)
eGFR: 105 mL/min/1.73m2 (ref >=60)

## 2024-01-02 LAB — IRON,TIBC AND FERRITIN PANEL
%SAT: 22 % (ref 16–45)
Ferritin: 69 ng/mL (ref 16–232)
Iron: 66 ug/dL (ref 45–160)
TIBC: 296 ug/dL (ref 250–450)

## 2024-01-02 LAB — CBC WITH DIFFERENTIAL/PLATELET
Absolute Lymphocytes: 1873 {cells}/uL (ref 850–3900)
Absolute Monocytes: 840 {cells}/uL (ref 200–950)
Basophils Absolute: 50 {cells}/uL (ref 0–200)
Basophils Relative: 0.6 %
Eosinophils Absolute: 370 {cells}/uL (ref 15–500)
Eosinophils Relative: 4.4 %
HCT: 46.7 % — ABNORMAL HIGH (ref 35.0–45.0)
Hemoglobin: 15.1 g/dL (ref 11.7–15.5)
MCH: 27.4 pg (ref 27.0–33.0)
MCHC: 32.3 g/dL (ref 32.0–36.0)
MCV: 84.6 fL (ref 80.0–100.0)
MPV: 11.8 fL (ref 7.5–12.5)
Monocytes Relative: 10 %
Neutro Abs: 5267 {cells}/uL (ref 1500–7800)
Neutrophils Relative %: 62.7 %
Platelets: 277 Thousand/uL (ref 140–400)
RBC: 5.52 Million/uL — ABNORMAL HIGH (ref 3.80–5.10)
RDW: 13.3 % (ref 11.0–15.0)
Total Lymphocyte: 22.3 %
WBC: 8.4 Thousand/uL (ref 3.8–10.8)

## 2024-01-02 LAB — FSH/LH
FSH: 9.3 m[IU]/mL
LH: 4.6 m[IU]/mL

## 2024-01-02 LAB — MICROALBUMIN / CREATININE URINE RATIO
Creatinine, Urine: 173 mg/dL (ref 20–275)
Microalb Creat Ratio: 5 mg/g{creat} (ref <30)
Microalb, Ur: 0.9 mg/dL

## 2024-01-02 LAB — HEMOGLOBIN A1C
Hgb A1c MFr Bld: 6.3 % — ABNORMAL HIGH (ref <5.7)
Mean Plasma Glucose: 134 mg/dL
eAG (mmol/L): 7.4 mmol/L

## 2024-01-02 LAB — TEST AUTHORIZATION

## 2024-01-02 LAB — LIPID PANEL
Cholesterol: 179 mg/dL (ref <200)
HDL: 50 mg/dL (ref >=50)
LDL Cholesterol (Calc): 98 mg/dL
Non-HDL Cholesterol (Calc): 129 mg/dL (ref <130)
Total CHOL/HDL Ratio: 3.6 (calc) (ref <5.0)
Triglycerides: 216 mg/dL — ABNORMAL HIGH (ref <150)

## 2024-01-02 LAB — TSH: TSH: 1.27 m[IU]/L

## 2024-01-02 LAB — B12 AND FOLATE PANEL
Folate: 12.8 ng/mL
Vitamin B-12: 508 pg/mL (ref 200–1100)

## 2024-01-02 LAB — VITAMIN D 25 HYDROXY (VIT D DEFICIENCY, FRACTURES): Vit D, 25-Hydroxy: 29 ng/mL — ABNORMAL LOW (ref 30–100)

## 2024-01-14 ENCOUNTER — Ambulatory Visit: Admitting: Sleep Medicine

## 2024-01-26 ENCOUNTER — Ambulatory Visit (INDEPENDENT_AMBULATORY_CARE_PROVIDER_SITE_OTHER): Admitting: Sleep Medicine

## 2024-01-26 ENCOUNTER — Encounter: Payer: Self-pay | Admitting: Sleep Medicine

## 2024-01-26 VITALS — BP 126/80 | HR 59 | Temp 98.1°F | Ht 74.5 in | Wt 270.2 lb

## 2024-01-26 DIAGNOSIS — E669 Obesity, unspecified: Secondary | ICD-10-CM | POA: Diagnosis not present

## 2024-01-26 DIAGNOSIS — G4733 Obstructive sleep apnea (adult) (pediatric): Secondary | ICD-10-CM | POA: Diagnosis not present

## 2024-01-26 DIAGNOSIS — Z6834 Body mass index (BMI) 34.0-34.9, adult: Secondary | ICD-10-CM | POA: Diagnosis not present

## 2024-01-26 NOTE — Progress Notes (Signed)
 Name:Krista Garza MRN: [id number] DOB: [birth year]   CHIEF COMPLAINT:  ESTABLISH CARE FOR OSA   HISTORY OF PRESENT ILLNESS: Krista Garza is a 48 y.o. w/ a h/o OSA, HTN and obesity who presents to establish care for OSA. Patient was initially diagnosed with mild OSA in 2023 and subsequently tried oral appliance therapy which did not work for her. Reports significant weight gain. Reports c/o loud snoring and excessive daytime sleepiness. Reports nocturnal awakenings due to nocturia and can have difficulty falling back to sleep. Admits to dry mouth. Denies morning headaches, RLS symptoms, dream enactment, cataplexy, hypnagogic or hypnapompic hallucinations. Reports a family history of sleep apnea. Reports drowsy driving. Drinks 2 cups of coffee daily, denies alcohol, tobacco or illicit drug use.   Bedtime 9-11 pm Sleep onset 5 mins Rise time 4-7 am   EPWORTH SLEEP SCORE     02/05/2021    3:39 PM 01/03/2021    2:19 PM  Results of the Epworth flowsheet  Sitting and reading 3 3  Watching TV 3 3  Sitting, inactive in a public place (e.g. a theatre or a meeting) 3 3  As a passenger in a car for an hour without a break 3 3  Lying down to rest in the afternoon when circumstances permit 3 3  Sitting and talking to someone 3 2  Sitting quietly after a lunch without alcohol 3 3  In a car, while stopped for a few minutes in traffic 3 2  Total score 24 22    PAST MEDICAL HISTORY :   has a past medical history of Anemia, Chest pain, unspecified, Hypertension, Pre-diabetes, and Sleep apnea (01/2021).  has a past surgical history that includes Cesarean section; Dilation and curettage of uterus; Hysteroscopy with D & C (N/A, 06/21/2019); Total laparoscopic hysterectomy with salpingectomy (Bilateral, 03/13/2020); Cystoscopy (N/A, 03/13/2020); and Colonoscopy with propofol  (N/A, 01/18/2021). Prior to Admission medications   Not on File   Allergies  Allergen Reactions   Latex Rash     Adhesive allergy   Adhesive [Tape] Rash    Paper tape okay.  bandaids are a problem.   Mango Flavoring Agent (Non-Screening) Rash   Passion Fruit Flavoring Agent (Non-Screening) Rash   Pineapple Rash    FAMILY HISTORY:  family history includes Cancer (age of onset: 43) in her father; Colon cancer in her father; Diabetes in her father; Hyperlipidemia in her father; Hypertension in her father. SOCIAL HISTORY:  reports that she has never smoked. She has never used smokeless tobacco. She reports that she does not currently use alcohol. She reports that she does not use drugs.   Review of Systems:  Gen:  Denies  fever, sweats, chills weight loss  HEENT: Denies blurred vision, double vision, ear pain, eye pain, hearing loss, nose bleeds, sore throat Cardiac:  No dizziness, chest pain or heaviness, chest tightness,edema, No JVD Resp:   No cough, -sputum production, -shortness of breath,-wheezing, -hemoptysis,  Gi: Denies swallowing difficulty, stomach pain, nausea or vomiting, diarrhea, constipation, bowel incontinence Gu:  Denies bladder incontinence, burning urine Ext:   Denies Joint pain, stiffness or swelling Skin: Denies  skin rash, easy bruising or bleeding or hives Endoc:  Denies polyuria, polydipsia , polyphagia or weight change Psych:   Denies depression, insomnia or hallucinations  Other:  All other systems negative  VITAL SIGNS: LMP 04/18/2019 (Approximate) Comment: 9 weeks ongoing menses BP 126/80   Pulse (!) 59   Temp 98.1 F (36.7 C)  Ht 6' 2.5 (1.892 m)   Wt 270 lb 3.2 oz (122.6 kg)   LMP 04/18/2019 (Approximate) Comment: 9 weeks ongoing menses  SpO2 95%   BMI 34.23 kg/m    Physical Examination:   General Appearance: No distress  EYES PERRLA, EOM intact.   NECK Supple, No JVD Pulmonary: normal breath sounds, No wheezing.  CardiovascularNormal S1,S2.  No m/r/g.   Abdomen: Benign, Soft, non-tender. Skin:   warm, no rashes, no ecchymosis  Extremities: normal,  no cyanosis, clubbing. Neuro:without focal findings,  speech normal  PSYCHIATRIC: Mood, affect within normal limits.   ASSESSMENT AND PLAN  OSA Starting patient on CPAP therapy set to 4-16 cm H2O.  Discussed the consequences of untreated sleep apnea. Advised not to drive drowsy for safety of patient and others. Will follow up in 3 months.    Obesity Counseled patient on diet and lifestyle modification.    Patient  satisfied with Plan of action and management. All questions answered  I spent a total of 47 minutes reviewing chart data, face-to-face evaluation with the patient, counseling and coordination of care as detailed above.    Ulla Mckiernan, M.D.  Sleep Medicine [facility]

## 2024-02-02 ENCOUNTER — Telehealth: Payer: Self-pay | Admitting: Sleep Medicine

## 2024-02-02 DIAGNOSIS — G4733 Obstructive sleep apnea (adult) (pediatric): Secondary | ICD-10-CM

## 2024-02-02 NOTE — Telephone Encounter (Signed)
 Dr. Jess saw the patient on 01/26/24 and placed order for new Cpap setup. The order was sent to [facility]. We received a note from [facility] unfortunately the order has been denied, she will need to complete a new sleep test. please advise thank you

## 2024-02-08 NOTE — Telephone Encounter (Signed)
Letter generated for pt.

## 2024-02-27 ENCOUNTER — Encounter

## 2024-02-27 DIAGNOSIS — G4733 Obstructive sleep apnea (adult) (pediatric): Secondary | ICD-10-CM

## 2024-03-14 DIAGNOSIS — R069 Unspecified abnormalities of breathing: Secondary | ICD-10-CM | POA: Diagnosis not present

## 2024-03-23 ENCOUNTER — Ambulatory Visit: Payer: Self-pay

## 2025-01-02 ENCOUNTER — Encounter: Admitting: Nurse Practitioner
